# Patient Record
Sex: Male | Born: 1975 | Race: White | Hispanic: No | Marital: Married | Smoking: Former smoker
Health system: Southern US, Community
[De-identification: ages and names within clinical notes are randomized; demographics above are authoritative.]

## PROBLEM LIST (undated history)

## (undated) DIAGNOSIS — I10 Essential (primary) hypertension: Secondary | ICD-10-CM

## (undated) HISTORY — PX: HERNIA REPAIR: SHX51

---

## 2011-03-29 ENCOUNTER — Other Ambulatory Visit: Payer: Self-pay

## 2011-03-29 ENCOUNTER — Ambulatory Visit (HOSPITAL_COMMUNITY)
Admission: RE | Admit: 2011-03-29 | Discharge: 2011-03-29 | Disposition: A | Discharge status: Home / Self Care | Payer: BC Managed Care – PPO | Source: Ambulatory Visit | Attending: Internal Medicine | Admitting: Internal Medicine

## 2011-03-29 ENCOUNTER — Other Ambulatory Visit (HOSPITAL_COMMUNITY): Payer: Self-pay | Admitting: Internal Medicine

## 2011-03-29 ENCOUNTER — Emergency Department (HOSPITAL_COMMUNITY)
Admission: EM | Admit: 2011-03-29 | Discharge: 2011-03-30 | Disposition: A | Discharge status: Home / Self Care | Payer: BC Managed Care – PPO | Attending: Emergency Medicine | Admitting: Emergency Medicine

## 2011-03-29 ENCOUNTER — Encounter: Payer: Self-pay | Admitting: *Deleted

## 2011-03-29 DIAGNOSIS — R0609 Other forms of dyspnea: Secondary | ICD-10-CM | POA: Insufficient documentation

## 2011-03-29 DIAGNOSIS — R55 Syncope and collapse: Secondary | ICD-10-CM | POA: Insufficient documentation

## 2011-03-29 DIAGNOSIS — R0989 Other specified symptoms and signs involving the circulatory and respiratory systems: Secondary | ICD-10-CM | POA: Insufficient documentation

## 2011-03-29 DIAGNOSIS — R071 Chest pain on breathing: Secondary | ICD-10-CM | POA: Insufficient documentation

## 2011-03-29 DIAGNOSIS — R0789 Other chest pain: Secondary | ICD-10-CM

## 2011-03-29 DIAGNOSIS — E876 Hypokalemia: Secondary | ICD-10-CM

## 2011-03-29 DIAGNOSIS — R079 Chest pain, unspecified: Secondary | ICD-10-CM

## 2011-03-29 HISTORY — DX: Essential (primary) hypertension: I10

## 2011-03-29 LAB — CBC
HCT: 38.7 % — ABNORMAL LOW (ref 39.0–52.0)
Hemoglobin: 13.6 g/dL (ref 13.0–17.0)
MCH: 31.5 pg (ref 26.0–34.0)
MCV: 89.6 fL (ref 78.0–100.0)
RBC: 4.32 MIL/uL (ref 4.22–5.81)

## 2011-03-29 LAB — COMPREHENSIVE METABOLIC PANEL
BUN: 10 mg/dL (ref 6–23)
CO2: 29 mEq/L (ref 19–32)
Calcium: 9.2 mg/dL (ref 8.4–10.5)
GFR calc Af Amer: >60 mL/min (ref >60)
GFR calc non Af Amer: >60 mL/min (ref >60)
Glucose, Bld: 126 mg/dL — ABNORMAL HIGH (ref 70–99)
Total Protein: 7 g/dL (ref 6.0–8.3)

## 2011-03-29 LAB — DIFFERENTIAL
Eosinophils Absolute: 0.1 10*3/uL (ref 0.0–0.7)
Eosinophils Relative: 1 % (ref 0–5)
Lymphocytes Relative: 15 % (ref 12–46)
Lymphs Abs: 1.5 10*3/uL (ref 0.7–4.0)
Monocytes Absolute: 0.6 10*3/uL (ref 0.1–1.0)
Monocytes Relative: 6 % (ref 3–12)

## 2011-03-29 LAB — CARDIAC PANEL(CRET KIN+CKTOT+MB+TROPI): CK, MB: 1.5 ng/mL (ref 0.3–4.0)

## 2011-03-29 MED ORDER — IOHEXOL 350 MG/ML SOLN
100.0000 mL | Freq: Once | INTRAVENOUS | Status: AC | PRN
  Administered 2011-03-29: 100 mL via INTRAVENOUS

## 2011-03-29 MED ORDER — POTASSIUM CHLORIDE CRYS ER 20 MEQ PO TBCR
40.0000 meq | EXTENDED_RELEASE_TABLET | Freq: Once | ORAL | Status: AC
  Administered 2011-03-30: 40 meq via ORAL
  Filled 2011-03-29: qty 2

## 2011-03-29 MED ORDER — HYDROCODONE-ACETAMINOPHEN 5-325 MG PO TABS
1.0000 | ORAL_TABLET | Freq: Once | ORAL | Status: AC
  Administered 2011-03-29: 1 via ORAL
  Filled 2011-03-29: qty 1

## 2011-03-29 MED ORDER — KETOROLAC TROMETHAMINE 60 MG/2ML IM SOLN
60.0000 mg | Freq: Once | INTRAMUSCULAR | Status: AC
  Administered 2011-03-29: 60 mg via INTRAMUSCULAR
  Filled 2011-03-29: qty 2

## 2011-03-29 MED ORDER — DIAZEPAM 5 MG PO TABS
5.0000 mg | ORAL_TABLET | Freq: Once | ORAL | Status: AC
  Administered 2011-03-29: 5 mg via ORAL
  Filled 2011-03-29: qty 1

## 2011-03-29 NOTE — ED Notes (Signed)
Chest pain since yesterday , no fever or cough.  Pain lt side of back.

## 2011-03-29 NOTE — ED Notes (Signed)
Ct scan of chest done today as op, says it was normal

## 2011-03-30 MED ORDER — DIAZEPAM 5 MG PO TABS: 5.0000 mg | ORAL_TABLET | Freq: Three times a day (TID) | ORAL | Status: AC | PRN

## 2011-03-30 MED ORDER — OXYCODONE-ACETAMINOPHEN 5-325 MG PO TABS
1.0000 | ORAL_TABLET | ORAL | Status: DC | PRN
  Filled 2011-03-30: qty 1

## 2011-03-30 MED ORDER — KETOROLAC TROMETHAMINE 10 MG PO TABS: 10.0000 mg | ORAL_TABLET | Freq: Four times a day (QID) | ORAL | Status: AC | PRN

## 2011-03-30 MED ORDER — POTASSIUM CHLORIDE CRYS ER 20 MEQ PO TBCR: 20.0000 meq | EXTENDED_RELEASE_TABLET | Freq: Two times a day (BID) | ORAL | Status: DC

## 2011-03-30 MED ORDER — OXYCODONE-ACETAMINOPHEN 5-325 MG PO TABS: 1.0000 | ORAL_TABLET | ORAL | Status: DC | PRN

## 2011-03-30 NOTE — ED Provider Notes (Signed)
Medical screening examination/treatment/procedure(s) were performed by non-physician practitioner and as supervising physician I was immediately available for consultation/collaboration.   Dayton Bailiff, MD 03/30/11 [phone]

## 2011-03-30 NOTE — ED Provider Notes (Addendum)
History     CSN: [id number] Arrival date & time: 03/29/2011  9:20 PM  Chief Complaint  Patient presents with  . Chest Pain    (Consider location/radiation/quality/duration/timing/severity/associated sxs/prior treatment) Patient is a 35 y.o. male presenting with chest pain. The history is provided by the patient, the spouse and a parent.  Chest Pain The chest pain began yesterday (Pain starts in posterior mid back and radiates around to left mid anterior chest.  The pain started yesterday after lifting a heavy object at work.). Chest pain occurs constantly. The chest pain is unchanged. The pain is associated with breathing and lifting (Pain is worsened by movement and palpation). At its most intense, the pain is at 8/10. The pain is currently at 8/10. The quality of the pain is described as sharp and pleuritic. Chest pain is worsened by certain positions, deep breathing and exertion. Primary symptoms include shortness of breath. Pertinent negatives for primary symptoms include no fever, no syncope, no abdominal pain, no nausea and no dizziness. Primary symptoms comment: He has pain with deep inspiration.  Associated symptoms include near-syncope.  Pertinent negatives for associated symptoms include no numbness and no weakness. Associated symptoms comments: He became lightheaded when he got to his car after leaving his physicians office this afternoon.  He did have a chest xray and a Ct angio chest ordered by his pcp which was negative for acute disease.Marland Kitchen He tried narcotics for the symptoms. Risk factors include no known risk factors.  His past medical history is significant for hypertension.  His family medical history is significant for heart disease in family. Family history comments: Parents with history of chf.     Past Medical History  Diagnosis Date  . Hypertension     Past Surgical History  Procedure Date  . Hernia repair     Family History  Problem Relation Age of Onset  .  Heart failure Mother   . Heart failure Father     History  Substance Use Topics  . Smoking status: Never Smoker   . Smokeless tobacco: Not on file  . Alcohol Use: No      Review of Systems  Constitutional: Negative for fever.  HENT: Negative for congestion, sore throat and neck pain.   Eyes: Negative.   Respiratory: Positive for shortness of breath. Negative for chest tightness.   Cardiovascular: Positive for chest pain and near-syncope. Negative for syncope.  Gastrointestinal: Negative for nausea and abdominal pain.  Genitourinary: Negative.   Musculoskeletal: Negative for joint swelling and arthralgias.  Skin: Negative.  Negative for rash and wound.  Neurological: Negative for dizziness, weakness, light-headedness, numbness and headaches.  Hematological: Negative.   Psychiatric/Behavioral: Negative.     Allergies  Review of patient's allergies indicates no known allergies.  Home Medications   Current Outpatient Rx  Name Route Sig Dispense Refill  . CEFDINIR 300 MG PO CAPS Oral Take 300 mg by mouth 2 (two) times daily.      Marland Kitchen HYDROCHLOROTHIAZIDE 25 MG PO TABS Oral Take 12.5 mg by mouth daily.      . IBUPROFEN 200 MG PO TABS Oral Take 800 mg by mouth as needed. For pain     . ACETAMINOPHEN 500 MG PO TABS Oral Take 500 mg by mouth as needed. For pain       BP 151/99  Pulse 95  Temp(Src) 98.5 F (36.9 C) (Oral)  Resp 22  Ht 5\' 9"  (1.753 m)  Wt 237 lb (107.502 kg)  BMI 35.00 kg/m2  SpO2 97%  Physical Exam  Nursing note and vitals reviewed. Constitutional: He is oriented to person, place, and time. He appears well-developed and well-nourished.  HENT:  Head: Normocephalic and atraumatic.  Eyes: Conjunctivae are normal.  Neck: Normal range of motion.  Cardiovascular: Normal rate, regular rhythm, normal heart sounds and intact distal pulses.   Pulmonary/Chest: Effort normal and breath sounds normal. He has no wheezes. He exhibits tenderness.    Abdominal: Soft.  Bowel sounds are normal. There is no tenderness.  Musculoskeletal: Normal range of motion.  Neurological: He is alert and oriented to person, place, and time.  Skin: Skin is warm and dry.  Psychiatric: He has a normal mood and affect.    ED Course  Procedures (including critical care time)  Labs Reviewed  CBC - Abnormal; Notable for the following:    HCT 38.7 (*)    All other components within normal limits  DIFFERENTIAL - Abnormal; Notable for the following:    Neutrophils Relative 78 (*)    Neutro Abs 8.0 (*)    All other components within normal limits  COMPREHENSIVE METABOLIC PANEL - Abnormal; Notable for the following:    Potassium 3.2 (*)    Glucose, Bld 126 (*)    All other components within normal limits  CARDIAC PANEL(CRET KIN+CKTOT+MB+TROPI)   Dg Chest 2 View  03/29/2011  *RADIOLOGY REPORT*  Clinical Data: Chest pain  CHEST - 2 VIEW  Comparison: None.  Findings: Very low lung volumes are seen bilaterally with bibasilar atelectasis.  No evidence of pulmonary consolidation or pleural effusion.  Heart size is within normal limits.  No mass or lymphadenopathy identified.  IMPRESSION: Very low lung volumes with bibasilar atelectasis.  Original Report Authenticated By: Danae Orleans, M.D.   Ct Angio Chest W/cm &/or Wo Cm  03/29/2011  *RADIOLOGY REPORT*  Clinical Data:  Chest pain.  Concern pulmonary embolism.  CT ANGIOGRAPHY CHEST WITH CONTRAST  Technique:  Multidetector CT imaging of the chest was performed using the standard protocol during bolus administration of intravenous contrast.  Multiplanar CT image reconstructions including MIPs were obtained to evaluate the vascular anatomy.  Contrast:  100 ml Omnipaque 300  Comparison:  Chest radiograph 03/29/2011  Findings:  Exam is suboptimal due to patient body habitus.  There are no filling defects in the pulmonary arteries to suggest acute pulmonary embolism.  No acute findings of the  aorta or great vessels.  No pericardial fluid.   Review of the lung parenchyma demonstrates atelectasis at the lung bases.  Mild ground-glass opacities at the lung bases also represents atelectasis or less likely edema.  No focal consolidation.  No pneumothorax.  Airways are normal.  Limited view of the upper abdomen is unremarkable.  Limited view of the skeleton is unremarkable.  Review of the MIP images confirms the above findings.  IMPRESSION:  1.  No evidence acute pulmonary embolism. 2.  Bibasilar atelectasis  Original Report Authenticated By: Genevive Bi, M.D.     No diagnosis found.    MDM  Chest wall pain/ muscle spasm,  Reproducible pain with negative ct angio prior to arrival today.         Candis Musa, PA 03/30/11 0021   Date: 03/30/2011  Rate: 90  Rhythm: normal sinus rhythm  QRS Axis: normal  Intervals: normal  ST/T Wave abnormalities: normal  Conduction Disutrbances:none  Narrative Interpretation:  Incomplete rbbb  Old EKG Reviewed: none available    Candis Musa, PA 03/30/11 0202  Jola Baptist  Dillin Lofgren, PA 04/06/11 1704

## 2011-04-01 NOTE — ED Provider Notes (Signed)
Addendum reviewed and agree  Dayton Bailiff, MD 04/01/11 [phone]

## 2011-04-06 NOTE — ED Provider Notes (Signed)
Additional addendum reviewed and agree  Dayton Bailiff, MD 04/06/11 1723

## 2012-05-10 ENCOUNTER — Encounter (HOSPITAL_COMMUNITY): Payer: Self-pay | Admitting: Emergency Medicine

## 2012-05-10 ENCOUNTER — Emergency Department (HOSPITAL_COMMUNITY)
Admission: EM | Admit: 2012-05-10 | Discharge: 2012-05-10 | Disposition: A | Discharge status: Home / Self Care | Payer: BC Managed Care – PPO | Attending: Emergency Medicine | Admitting: Emergency Medicine

## 2012-05-10 DIAGNOSIS — I10 Essential (primary) hypertension: Secondary | ICD-10-CM | POA: Insufficient documentation

## 2012-05-10 DIAGNOSIS — E876 Hypokalemia: Secondary | ICD-10-CM | POA: Insufficient documentation

## 2012-05-10 DIAGNOSIS — Z79899 Other long term (current) drug therapy: Secondary | ICD-10-CM | POA: Insufficient documentation

## 2012-05-10 LAB — BASIC METABOLIC PANEL
CO2: 28 mEq/L (ref 19–32)
Calcium: 10 mg/dL (ref 8.4–10.5)
Chloride: 99 mEq/L (ref 96–112)
Creatinine, Ser: 0.81 mg/dL (ref 0.50–1.35)
GFR calc Af Amer: >90 mL/min (ref >90)
Sodium: 139 mEq/L (ref 135–145)

## 2012-05-10 LAB — CBC WITH DIFFERENTIAL/PLATELET
Basophils Absolute: 0 10*3/uL (ref 0.0–0.1)
Eosinophils Relative: 1 % (ref 0–5)
HCT: 42.7 % (ref 39.0–52.0)
Lymphocytes Relative: 20 % (ref 12–46)
Lymphs Abs: 1.6 10*3/uL (ref 0.7–4.0)
MCV: 89.9 fL (ref 78.0–100.0)
Neutro Abs: 5.7 10*3/uL (ref 1.7–7.7)
Platelets: 230 10*3/uL (ref 150–400)
RBC: 4.75 MIL/uL (ref 4.22–5.81)
RDW: 12.9 % (ref 11.5–15.5)
WBC: 7.9 10*3/uL (ref 4.0–10.5)

## 2012-05-10 MED ORDER — POTASSIUM CHLORIDE CRYS ER 20 MEQ PO TBCR
40.0000 meq | EXTENDED_RELEASE_TABLET | Freq: Once | ORAL | Status: AC
  Administered 2012-05-10: 40 meq via ORAL
  Filled 2012-05-10: qty 2

## 2012-05-10 NOTE — ED Notes (Signed)
Patient with c/o chest pain and hypertension that started last night. +dizziness. Denies n/v, denies diaphoresis.

## 2012-05-10 NOTE — ED Provider Notes (Signed)
History   This chart was scribed for Cheri Guppy, MD by Melba Coon, ED Scribe. The patient was seen in room APA15/APA15 and the patient's care was started at 3:46PM.    CSN: [id number]  Arrival date & time 05/10/12  1415   First MD Initiated Contact with Patient 05/10/12 1542      Chief Complaint  Patient presents with  . Chest Pain  . Hypertension    (Consider location/radiation/quality/duration/timing/severity/associated sxs/prior treatment) The history is provided by the patient and the spouse. No language interpreter was used.   Chris Griffith is a 36 y.o. male who presents to the Emergency Department complaining of feeling tired and dizzy with a onset last night. Wife reports that BP was 150/100 last night. He currently take hydrochlorothiazide to manage his HTN. He reports fatigue since yesterday and reports dizziness and pallor. He denies any chest pain currently here at the ED. Denies rash, back pain, abd pain, n/v/d. No other pertinent medical symptoms.  Past Medical History  Diagnosis Date  . Hypertension     Past Surgical History  Procedure Date  . Hernia repair     Family History  Problem Relation Age of Onset  . Heart failure Mother   . Heart failure Father     History  Substance Use Topics  . Smoking status: Never Smoker   . Smokeless tobacco: Not on file  . Alcohol Use: No      Review of Systems 10 Systems reviewed and all are negative for acute change except as noted in the HPI.   Allergies  Review of patient's allergies indicates no known allergies.  Home Medications   Current Outpatient Rx  Name  Route  Sig  Dispense  Refill  . ACETAMINOPHEN 500 MG PO TABS   Oral   Take 500 mg by mouth as needed. For pain          . CEFDINIR 300 MG PO CAPS   Oral   Take 300 mg by mouth 2 (two) times daily.           Marland Kitchen HYDROCHLOROTHIAZIDE 25 MG PO TABS   Oral   Take 12.5 mg by mouth daily.           . IBUPROFEN 200 MG PO TABS  Oral   Take 800 mg by mouth as needed. For pain          . POTASSIUM CHLORIDE CRYS ER 20 MEQ PO TBCR   Oral   Take 1 tablet (20 mEq total) by mouth 2 (two) times daily.   14 tablet   0     BP 127/88  Pulse 93  Temp 98.1 F (36.7 C) (Oral)  Resp 20  Ht 5\' 9"  (1.753 m)  Wt 230 lb (104.327 kg)  BMI 33.96 kg/m2  SpO2 95%  Physical Exam  Nursing note and vitals reviewed. Constitutional:       Awake, alert, nontoxic appearance.  HENT:  Head: Normocephalic and atraumatic.  Eyes: Conjunctivae normal and EOM are normal. Pupils are equal, round, and reactive to light. Right eye exhibits no discharge. Left eye exhibits no discharge.  Neck: Normal range of motion. Neck supple.       No carotid bruit bilaterally.  Cardiovascular: Normal rate, regular rhythm and intact distal pulses.  Exam reveals gallop.   No murmur heard. Pulmonary/Chest: Effort normal and breath sounds normal. No respiratory distress. He has no wheezes. He has no rales. He exhibits no tenderness.  Abdominal: Soft. Bowel  sounds are normal. There is no tenderness. There is no rebound.  Musculoskeletal: He exhibits no tenderness.       Baseline ROM, no obvious new focal weakness.  Neurological:       Mental status and motor strength appears baseline for patient and situation.  Skin: No rash noted.  Psychiatric: He has a normal mood and affect.    ED Course  Procedures (including critical care time)  DIAGNOSTIC STUDIES: Oxygen Saturation is 99% on room air, normal by my interpretation.    COORDINATION OF CARE:  3:51PM - general chemistries will be ordered for [name].   Labs Reviewed - No data to display No results found.   No diagnosis found.    MDM  Fatigue Mild hypokalemia  I personally performed the services described in this documentation, which was scribed in my presence. The recorded information has been reviewed and is accurate.        Cheri Guppy, MD 05/10/12 1731

## 2012-06-05 ENCOUNTER — Emergency Department (HOSPITAL_COMMUNITY)
Admission: EM | Admit: 2012-06-05 | Discharge: 2012-06-05 | Disposition: A | Discharge status: Home / Self Care | Payer: BC Managed Care – PPO | Attending: Emergency Medicine | Admitting: Emergency Medicine

## 2012-06-05 ENCOUNTER — Encounter (HOSPITAL_COMMUNITY): Payer: Self-pay | Admitting: Emergency Medicine

## 2012-06-05 DIAGNOSIS — K529 Noninfective gastroenteritis and colitis, unspecified: Secondary | ICD-10-CM

## 2012-06-05 DIAGNOSIS — Z79899 Other long term (current) drug therapy: Secondary | ICD-10-CM | POA: Insufficient documentation

## 2012-06-05 DIAGNOSIS — K5289 Other specified noninfective gastroenteritis and colitis: Secondary | ICD-10-CM | POA: Insufficient documentation

## 2012-06-05 DIAGNOSIS — R197 Diarrhea, unspecified: Secondary | ICD-10-CM | POA: Insufficient documentation

## 2012-06-05 DIAGNOSIS — I10 Essential (primary) hypertension: Secondary | ICD-10-CM | POA: Insufficient documentation

## 2012-06-05 LAB — CBC WITH DIFFERENTIAL/PLATELET
Basophils Absolute: 0 10*3/uL (ref 0.0–0.1)
Eosinophils Relative: 0 % (ref 0–5)
HCT: 42.5 % (ref 39.0–52.0)
Hemoglobin: 15.1 g/dL (ref 13.0–17.0)
Lymphocytes Relative: 2 % — ABNORMAL LOW (ref 12–46)
Lymphs Abs: 0.3 10*3/uL — ABNORMAL LOW (ref 0.7–4.0)
MCV: 89.9 fL (ref 78.0–100.0)
Monocytes Absolute: 0.3 10*3/uL (ref 0.1–1.0)
Monocytes Relative: 2 % — ABNORMAL LOW (ref 3–12)
Neutro Abs: 10.7 10*3/uL — ABNORMAL HIGH (ref 1.7–7.7)
RBC: 4.73 MIL/uL (ref 4.22–5.81)
WBC: 11.2 10*3/uL — ABNORMAL HIGH (ref 4.0–10.5)

## 2012-06-05 LAB — BASIC METABOLIC PANEL
BUN: 16 mg/dL (ref 6–23)
CO2: 26 mEq/L (ref 19–32)
Calcium: 8.9 mg/dL (ref 8.4–10.5)
Chloride: 102 mEq/L (ref 96–112)
Creatinine, Ser: 0.79 mg/dL (ref 0.50–1.35)
Glucose, Bld: 128 mg/dL — ABNORMAL HIGH (ref 70–99)

## 2012-06-05 MED ORDER — PROMETHAZINE HCL 25 MG/ML IJ SOLN
12.5000 mg | Freq: Once | INTRAMUSCULAR | Status: AC
  Administered 2012-06-05: 12.5 mg via INTRAVENOUS
  Filled 2012-06-05: qty 1

## 2012-06-05 MED ORDER — ONDANSETRON HCL 4 MG/2ML IJ SOLN
4.0000 mg | Freq: Once | INTRAMUSCULAR | Status: AC
  Administered 2012-06-05: 4 mg via INTRAVENOUS
  Filled 2012-06-05: qty 2

## 2012-06-05 MED ORDER — ONDANSETRON HCL 4 MG/2ML IJ SOLN
4.0000 mg | Freq: Once | INTRAMUSCULAR | Status: AC
Start: 2012-06-05 — End: 2012-06-05
  Administered 2012-06-05: 4 mg via INTRAVENOUS
  Filled 2012-06-05: qty 2

## 2012-06-05 MED ORDER — KETOROLAC TROMETHAMINE 30 MG/ML IJ SOLN
30.0000 mg | Freq: Once | INTRAMUSCULAR | Status: AC
  Administered 2012-06-05: 30 mg via INTRAVENOUS
  Filled 2012-06-05: qty 1

## 2012-06-05 MED ORDER — PROMETHAZINE HCL 25 MG PO TABS: 25.0000 mg | ORAL_TABLET | Freq: Four times a day (QID) | ORAL | Status: DC | PRN

## 2012-06-05 MED ORDER — SODIUM CHLORIDE 0.9 % IV BOLUS (SEPSIS)
1000.0000 mL | Freq: Once | INTRAVENOUS | Status: AC
  Administered 2012-06-05: 1000 mL via INTRAVENOUS

## 2012-06-05 NOTE — ED Notes (Signed)
Pt reports severe nausea

## 2012-06-05 NOTE — ED Notes (Signed)
Pt started with n/v/d since 12am. Family ate at [place] and all three have had similar symptoms just not as serve as patient.

## 2012-06-05 NOTE — ED Provider Notes (Signed)
History   This chart was scribed for Geoffery Lyons, MD by Charolett Bumpers, ED Scribe. The patient was seen in room APA10/APA10. Patient's care was started at 0723.   CSN: [id number]  Arrival date & time 06/05/12  0720   First MD Initiated Contact with Patient 06/05/12 [phone]      Chief Complaint  Patient presents with  . Emesis  . Diarrhea    The history is provided by the patient. No language interpreter was used.  Chris Griffith is a 36 y.o. male who presents to the Emergency Department complaining of multiple episodes of moderate vomiting with associated diarrhea since 12 am this morning. Family states that they ate [place] food last night. Within 2 hours of eating, the pt started vomiting. She states that her son also had similar symptoms after eating the same thing. The pt also reports associated nausea and minimal abdominal discomfort. She states that she also noted hematemesis with droplets of blood. He denies any melena. He has a h/o hernia repair but denies any other abdominal surgeries. He also has a h/o HTN.   Past Medical History  Diagnosis Date  . Hypertension     Past Surgical History  Procedure Date  . Hernia repair     Family History  Problem Relation Age of Onset  . Heart failure Mother   . Heart failure Father     History  Substance Use Topics  . Smoking status: Never Smoker   . Smokeless tobacco: Not on file  . Alcohol Use: No      Review of Systems  Gastrointestinal: Positive for nausea, vomiting, abdominal pain and diarrhea.  All other systems reviewed and are negative.    Allergies  Codeine  Home Medications   Current Outpatient Rx  Name  Route  Sig  Dispense  Refill  . ACETAMINOPHEN 500 MG PO TABS   Oral   Take 500 mg by mouth as needed. For pain          . HYDROCHLOROTHIAZIDE 25 MG PO TABS   Oral   Take 12.5 mg by mouth daily.           . IBUPROFEN 200 MG PO TABS   Oral   Take 800 mg by mouth as needed. For pain           . POTASSIUM CHLORIDE CRYS ER 20 MEQ PO TBCR   Oral   Take 1 tablet (20 mEq total) by mouth 2 (two) times daily.   14 tablet   0     There were no vitals taken for this visit.  Physical Exam  Nursing note and vitals reviewed. Constitutional: He is oriented to person, place, and time. He appears well-developed and well-nourished. No distress.  HENT:  Head: Normocephalic and atraumatic.  Right Ear: External ear normal.  Left Ear: External ear normal.  Nose: Nose normal.  Mouth/Throat: Oropharynx is clear and moist.  Eyes: Conjunctivae normal and EOM are normal. Pupils are equal, round, and reactive to light.  Neck: Normal range of motion. Neck supple. No tracheal deviation present.  Cardiovascular: Normal rate, regular rhythm and normal heart sounds.   No murmur heard. Pulmonary/Chest: Effort normal and breath sounds normal. No respiratory distress. He has no wheezes.  Abdominal: Soft. Bowel sounds are normal. He exhibits no distension. There is no tenderness.  Musculoskeletal: Normal range of motion. He exhibits no edema.  Neurological: He is alert and oriented to person, place, and time.  Skin: Skin is  warm and dry.  Psychiatric: He has a normal mood and affect. His behavior is normal.    ED Course  Procedures (including critical care time)  DIAGNOSTIC STUDIES: Oxygen Saturation is 97% on room air, adequate by my interpretation.    COORDINATION OF CARE:  08:40-Discussed planned course of treatment with the patient including IV fluids, pain and nausea medication, CBC and BMP, who is agreeable at this time.   08:45-Medication Orders: Sodium chloride 0.9% bolus 1,000 mL-once; Ketorolac (Toradol) 30 mg/mL injection 30 mg-once; Ondansetron (Zofran) injection 4 mg-once.   09:35-Recheck: Informed pt of lab results. Pt states that he still feels nauseated. Will give pt Phenergan prior to d/c.   Labs Reviewed - No data to display No results found.   No diagnosis  found.    MDM  Presentation, exam, and test results consistent with viral gastroenteritis.  Will discharge with phenergan, return prn.  Wife reports seeing blood in vomit, likely mallory weiss in nature.  His Hb is stable and appears comfortable.       I personally performed the services described in this documentation, which was scribed in my presence. The recorded information has been reviewed and is accurate.       Geoffery Lyons, MD 06/05/12 [phone]

## 2012-10-29 HISTORY — PX: VASECTOMY: SHX75

## 2013-01-14 ENCOUNTER — Encounter: Payer: Self-pay | Admitting: Family Medicine

## 2013-01-14 ENCOUNTER — Ambulatory Visit (INDEPENDENT_AMBULATORY_CARE_PROVIDER_SITE_OTHER): Payer: BC Managed Care – PPO | Admitting: Family Medicine

## 2013-01-14 VITALS — BP 128/90 | Temp 98.3°F | Wt 226.0 lb

## 2013-01-14 DIAGNOSIS — Z79899 Other long term (current) drug therapy: Secondary | ICD-10-CM

## 2013-01-14 DIAGNOSIS — J329 Chronic sinusitis, unspecified: Secondary | ICD-10-CM

## 2013-01-14 DIAGNOSIS — I10 Essential (primary) hypertension: Secondary | ICD-10-CM | POA: Insufficient documentation

## 2013-01-14 MED ORDER — ONDANSETRON 4 MG PO TBDP: 4.0000 mg | ORAL_TABLET | Freq: Four times a day (QID) | ORAL | Status: DC | PRN

## 2013-01-14 MED ORDER — LEVOFLOXACIN 500 MG PO TABS: 500.0000 mg | ORAL_TABLET | Freq: Every day | ORAL | Status: AC

## 2013-01-14 MED ORDER — ENALAPRIL MALEATE 10 MG PO TABS: 10.0000 mg | ORAL_TABLET | Freq: Every day | ORAL | Status: DC

## 2013-01-14 NOTE — Progress Notes (Signed)
  Subjective:    Patient ID: Chris Griffith., male    DOB: Jul 05, 1975, 37 y.o.   MRN: [id number]  Sinusitis This is a chronic problem. The current episode started more than 1 month ago. The problem has been gradually worsening since onset. Associated symptoms include congestion, coughing, headaches and a hoarse voice. Past treatments include antibiotics (took amox and took it.). The treatment provided no relief.   Persist headaches on z pk following the amox  Frontal headache. Tender node on post of neck Patient has history of hypertension. He is just on one half a hydrochlorothiazide tablet daily. He feels it may cause him fatigue. He also is had difficulty with low potassium.  Review of Systems  HENT: Positive for congestion and hoarse voice.   Respiratory: Positive for cough.   Neurological: Positive for headaches.       Objective:   Physical Exam Alert mild malaise. Blood pressure 140/90 on repeat. Lungs clear. Heart regular rate and rhythm. Frontal tenderness. TMs normal pharynx slight erythema neck supple       Assessment & Plan:  Impression 1 subacute sinusitis. #2 hypertension suboptimal in control. plan Levaquin 500 daily for 14 days due to subacute nature. Change to Vasotec 10 mg each bedtime. Stop hydrochlorothiazide. Recheck as scheduled. WSL

## 2013-01-15 LAB — LIPID PANEL
Cholesterol: 167 mg/dL (ref 0–200)
LDL Cholesterol: 97 mg/dL (ref 0–99)
Total CHOL/HDL Ratio: 4.9 Ratio
VLDL: 36 mg/dL (ref 0–40)

## 2013-01-15 LAB — HEPATIC FUNCTION PANEL
ALT: 37 U/L (ref 0–53)
Bilirubin, Direct: 0.1 mg/dL (ref 0.0–0.3)

## 2013-01-15 LAB — BASIC METABOLIC PANEL
Chloride: 102 mEq/L (ref 96–112)
Glucose, Bld: 103 mg/dL — ABNORMAL HIGH (ref 70–99)
Potassium: 4.4 mEq/L (ref 3.5–5.3)
Sodium: 140 mEq/L (ref 135–145)

## 2013-01-18 ENCOUNTER — Telehealth: Payer: Self-pay | Admitting: Family Medicine

## 2013-01-18 ENCOUNTER — Encounter: Payer: Self-pay | Admitting: Family Medicine

## 2013-01-18 NOTE — Telephone Encounter (Signed)
We saw the pt four d ago for rhinosinusitis--I think it is premature to lable this as ace-induced cough. Go back to old fluid pill. Then restart ace inhibito in a few wks. If cough appears, call us

## 2013-01-18 NOTE — Telephone Encounter (Signed)
Pt calling with a cough today and states it is due to his BP medicine he just started. Was told to call in if there were any issues, what do you recommend. [facility]

## 2013-01-18 NOTE — Telephone Encounter (Signed)
Wife called to check the status of this request.  Informed her due to computer issues we were running a little behind.  States he started coughing immediately after going onto the new BP Medication.

## 2013-01-18 NOTE — Telephone Encounter (Signed)
Notified pt the doctor thinks it is premature to label this as ace-induced cough. Go back to old fluid pill. Then restart ace inhibitor in a few wks. If cough appears, call us. Patient verbalized understanding.

## 2013-02-02 ENCOUNTER — Encounter: Payer: Self-pay | Admitting: Family Medicine

## 2013-02-02 ENCOUNTER — Ambulatory Visit (INDEPENDENT_AMBULATORY_CARE_PROVIDER_SITE_OTHER): Payer: BC Managed Care – PPO | Admitting: Family Medicine

## 2013-02-02 VITALS — BP 148/94 | Temp 98.3°F | Ht 69.0 in | Wt 233.0 lb

## 2013-02-02 DIAGNOSIS — R1030 Lower abdominal pain, unspecified: Secondary | ICD-10-CM

## 2013-02-02 DIAGNOSIS — R109 Unspecified abdominal pain: Secondary | ICD-10-CM

## 2013-02-02 MED ORDER — DOXYCYCLINE HYCLATE 100 MG PO CAPS: 100.0000 mg | ORAL_CAPSULE | Freq: Two times a day (BID) | ORAL | Status: DC

## 2013-02-02 NOTE — Progress Notes (Signed)
  Subjective:    Patient ID: Chris Griffith., male    DOB: [birth year], 37 y.o.   MRN: [id number]  HPIHaving pain in groin area and goes down right leg. Started last week. Has been taking tylenol with mild relief. Knot in testicle noticied last sat.  This patient relates he had a vasectomy earlier this year he has pain in the right groin sometimes radiates down the right leg been present for the past several days denies dysuria denies high fever sweats chills nausea vomiting or diarrhea. In addition to this the discomfort is more prominent in certain positions. No pain with sex. No hematuria. PMH benign FMH benign  Review of Systems see above     Objective:   Physical Exam Lungs clear heart regular abdomen soft no masses no hernia detected testicular exam totally normal bilateral epididymitis area is nontender there is no masses felt but not he is concerned about I think is more the vas deferens on the right side  Prostate exam was normal     Assessment & Plan:  Groin pain-perplexing why he has a spirit I cannot totally nail down what is causing this. I think it is reasonable to try cool compresses doxycycline twice a day for 1 week in case this is developing epididymitis. If the symptoms persist he needs to let us know. I told the patient if he gets worse he needs to let us know. May need to go back to urology depending on how he does. Warning signs were discussed  He was told to followup if ongoing or call.

## 2013-04-13 ENCOUNTER — Encounter: Payer: Self-pay | Admitting: Family Medicine

## 2013-04-13 ENCOUNTER — Ambulatory Visit (INDEPENDENT_AMBULATORY_CARE_PROVIDER_SITE_OTHER): Payer: BC Managed Care – PPO | Admitting: Family Medicine

## 2013-04-13 VITALS — BP 138/96 | Temp 98.6°F | Ht 69.0 in | Wt 235.2 lb

## 2013-04-13 DIAGNOSIS — I10 Essential (primary) hypertension: Secondary | ICD-10-CM

## 2013-04-13 DIAGNOSIS — R21 Rash and other nonspecific skin eruption: Secondary | ICD-10-CM

## 2013-04-13 MED ORDER — VERAPAMIL HCL ER 180 MG PO TBCR: 180.0000 mg | EXTENDED_RELEASE_TABLET | Freq: Every day | ORAL | Status: DC

## 2013-04-13 MED ORDER — TRIAMCINOLONE ACETONIDE 0.1 % EX CREA: TOPICAL_CREAM | Freq: Two times a day (BID) | CUTANEOUS | Status: DC

## 2013-04-13 NOTE — Progress Notes (Signed)
  Subjective:    Patient ID: Chris Griffith., male    DOB: Oct 16, 1975, 37 y.o.   MRN: [id number]  Foot Pain This is a new problem. The current episode started in the past 7 days. The problem occurs constantly. The problem has been gradually worsening. The symptoms are aggravated by walking and standing. He has tried NSAIDs for the symptoms. The treatment provided no relief.   Foot tingling. Felt like a know in it. Went away  Felt swollen, no major problem.  Itches and burns,  Painful at times, itchy at times,    Review of Systems No trouble swallowing no chest pain no headache    Objective:   Physical Exam  Alert no apparent distress. Lungs clear. Heart regular in rhythm. Pressure 134/90. Feet patchy rash evident somewhat pruritic.      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 Start verapamil. Triamcinolone cream twice a day to affected area. Recheck in several months. WSL

## 2013-04-15 ENCOUNTER — Telehealth: Payer: Self-pay | Admitting: Family Medicine

## 2013-04-15 ENCOUNTER — Encounter: Payer: Self-pay | Admitting: Family Medicine

## 2013-04-15 NOTE — Telephone Encounter (Signed)
Pt wants to know if he can have a WE for 10/14-10/17 to return on 10/20. His feet are still giving him issues. Getting better but feeels he needs the extra days

## 2013-04-15 NOTE — Telephone Encounter (Signed)
ok 

## 2013-04-16 NOTE — Telephone Encounter (Signed)
Pt notified to pick up note up front

## 2013-10-18 ENCOUNTER — Ambulatory Visit (INDEPENDENT_AMBULATORY_CARE_PROVIDER_SITE_OTHER): Payer: BC Managed Care – PPO | Admitting: Family Medicine

## 2013-10-18 ENCOUNTER — Encounter: Payer: Self-pay | Admitting: Family Medicine

## 2013-10-18 VITALS — BP 148/98 | Ht 69.0 in | Wt 232.0 lb

## 2013-10-18 DIAGNOSIS — M549 Dorsalgia, unspecified: Secondary | ICD-10-CM

## 2013-10-18 MED ORDER — CHLORZOXAZONE 500 MG PO TABS: 500.0000 mg | ORAL_TABLET | Freq: Three times a day (TID) | ORAL | Status: DC | PRN

## 2013-10-18 MED ORDER — ETODOLAC 400 MG PO TABS: 400.0000 mg | ORAL_TABLET | Freq: Two times a day (BID) | ORAL | Status: DC

## 2013-10-18 NOTE — Progress Notes (Signed)
   Subjective:    Patient ID: Chris Holidayonald Keith Vasbinder Jr., male    DOB: 07/24/1975, 38 y.o.   MRN: [id number]  Back Pain This is a chronic problem. The current episode started more than 1 year ago (15 years ago). The problem has been waxing and waning since onset. Pain location: Right side of lower back. The pain radiates to the right thigh. The pain is worse during the day. The symptoms are aggravated by sitting (Lifting and driving). Stiffness is present in the morning. He has tried NSAIDs, ice and heat for the symptoms. The treatment provided no relief.   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   no vomiting no diarrhea ROS otherwise negative     Objective:   Physical Exam Alert blood pressure repeat 138/86. HEENT normal neck supple. Lungs clear. Heart rare rhythm. Right paralumbar tenderness. Negative straight leg raise.       Assessment & Plan:  Impression #1 lumbar strain discussed, recurrent symptomatology in recent years. History of remote work injury #2 hypertension control with just diet and exercise at this point. Plan Lodine twice a day with food. Chlorzoxazone when necessary. Regular low back exercises strongly encourage. Diet exercise discussed. WSL

## 2013-10-19 ENCOUNTER — Encounter: Payer: Self-pay | Admitting: Family Medicine

## 2013-12-13 ENCOUNTER — Ambulatory Visit: Payer: BC Managed Care – PPO | Admitting: Family Medicine

## 2013-12-13 ENCOUNTER — Ambulatory Visit (INDEPENDENT_AMBULATORY_CARE_PROVIDER_SITE_OTHER): Payer: BC Managed Care – PPO | Admitting: Family Medicine

## 2013-12-13 ENCOUNTER — Encounter: Payer: Self-pay | Admitting: Family Medicine

## 2013-12-13 VITALS — BP 128/90 | Temp 98.2°F | Ht 69.0 in | Wt 226.0 lb

## 2013-12-13 DIAGNOSIS — L738 Other specified follicular disorders: Secondary | ICD-10-CM

## 2013-12-13 DIAGNOSIS — L739 Follicular disorder, unspecified: Secondary | ICD-10-CM

## 2013-12-13 MED ORDER — CEPHALEXIN 500 MG PO CAPS: 500.0000 mg | ORAL_CAPSULE | Freq: Three times a day (TID) | ORAL | Status: DC

## 2013-12-13 NOTE — Progress Notes (Signed)
   Subjective:    Patient ID: Chris Holidayonald Keith Madole Jr., male    DOB: Jun 17, 1976, 38 y.o.   MRN: [id number]  HPI  Patient is here today for a knot on the back of his head. It is on the right side, behind his ear.  He first noticed it Thursday.  He does have some nasal congestion.  No other s/s  Patient also notes a rash in his scalp. Pimple-like rash. Somewhat tender.  Review of Systems No vomiting no headache no trouble swallowing no chest pain no fever ROS otherwise negative    Objective:   Physical Exam  Alert blood pressure repeat 122/88 HEENT right posterior scalp folliculitis ruptured. Right posterior cervical chain reactive lymph node palpated neck supple pharynx normal. Lungs clear heart regular in rhythm.      Assessment & Plan:  Impression folliculitis with secondary lymphadenitis discussed plan Keflex 500 3 times a day 7 days. Symptomatic care discussed. Local measures discussed. WSL

## 2014-01-07 ENCOUNTER — Telehealth: Payer: Self-pay | Admitting: Family Medicine

## 2014-01-07 MED ORDER — TRIAMCINOLONE ACETONIDE 0.1 % EX CREA: 1.0000 "application " | TOPICAL_CREAM | Freq: Two times a day (BID) | CUTANEOUS | Status: DC | PRN

## 2014-01-07 NOTE — Telephone Encounter (Signed)
May give refill of this x260 g tube apply twice a day when necessary

## 2014-01-07 NOTE — Telephone Encounter (Signed)
triamcinolone cream (KENALOG) 0.1 %  Pt has some rash around his ankles from weed eating  He tried this cream you had given him before an it had  Worked to clear it up some but is out now. Can we call In some more to   [facility]

## 2014-01-07 NOTE — Telephone Encounter (Signed)
Pt's wife notified.

## 2014-01-07 NOTE — Telephone Encounter (Signed)
Medication was sent to pharmacy.

## 2015-08-16 ENCOUNTER — Encounter: Payer: Self-pay | Admitting: Family Medicine

## 2015-08-16 ENCOUNTER — Ambulatory Visit (INDEPENDENT_AMBULATORY_CARE_PROVIDER_SITE_OTHER): Payer: BLUE CROSS/BLUE SHIELD | Admitting: Family Medicine

## 2015-08-16 VITALS — BP 138/96 | Temp 98.6°F | Ht 69.0 in | Wt 240.0 lb

## 2015-08-16 DIAGNOSIS — K2100 Gastro-esophageal reflux disease with esophagitis, without bleeding: Secondary | ICD-10-CM

## 2015-08-16 DIAGNOSIS — J329 Chronic sinusitis, unspecified: Secondary | ICD-10-CM | POA: Diagnosis not present

## 2015-08-16 DIAGNOSIS — I10 Essential (primary) hypertension: Secondary | ICD-10-CM | POA: Diagnosis not present

## 2015-08-16 DIAGNOSIS — Z13220 Encounter for screening for lipoid disorders: Secondary | ICD-10-CM | POA: Diagnosis not present

## 2015-08-16 DIAGNOSIS — R079 Chest pain, unspecified: Secondary | ICD-10-CM | POA: Diagnosis not present

## 2015-08-16 DIAGNOSIS — Z79899 Other long term (current) drug therapy: Secondary | ICD-10-CM | POA: Diagnosis not present

## 2015-08-16 MED ORDER — AZITHROMYCIN 250 MG PO TABS: ORAL_TABLET | ORAL | Status: DC

## 2015-08-16 MED ORDER — PANTOPRAZOLE SODIUM 40 MG PO TBEC: 40.0000 mg | DELAYED_RELEASE_TABLET | Freq: Every day | ORAL | Status: DC

## 2015-08-16 MED ORDER — SUCRALFATE 1 G PO TABS: ORAL_TABLET | ORAL | Status: DC

## 2015-08-16 NOTE — Progress Notes (Signed)
   Subjective:    Patient ID: Chris Griffith., male    DOB: [birth year], 40 y.o.   MRN: [id number]  Chest Pain  This is a new problem. Episode onset: 6 days ago. Quality: feels like gas pressure. Associated symptoms include back pain and nausea. Associated symptoms comments: Chest pain on right side. The pain is aggravated by food. Treatments tried: gas pills, ibuprofen.   Headache, coughing up green mucus, sinus drainage, no fever, no wheezing. Started 2 days ago.  Requesting zpack.   Took gas x and tried some probiotics  After lunch  Pos hx of reflux  Sat night got the worst, usually worse after eating  Some radiation to back   Some nausea,  No sob no diaphoresis   Patient quit smoking 10 years ago   Review of Systems  Cardiovascular: Positive for chest pain.  Gastrointestinal: Positive for nausea.  Musculoskeletal: Positive for back pain.       Objective:   Physical Exam  Alert no major distress H&T moderate his congestion pharynx normal neck supple lungs clear heart abdomen benign  EKG normal sinus rhythm no significant ST-T changes      Assessment & Plan:  Impression 1 chest pain likely gastroesophageal reflux/esophagitis in nature discussed #2 elevated blood pressure over 3 rhinosinusitis plan screening blood work. Trial of Protonix and Carafate. Warning signs discussed carefully. Appropriate screening blood work requested by patient WSL

## 2015-08-17 ENCOUNTER — Telehealth: Payer: Self-pay | Admitting: Family Medicine

## 2015-08-17 LAB — BASIC METABOLIC PANEL
BUN / CREAT RATIO: 14 (ref 9–20)
BUN: 12 mg/dL (ref 6–24)
CO2: 24 mmol/L (ref 18–29)
CREATININE: 0.88 mg/dL (ref 0.76–1.27)
Calcium: 9.9 mg/dL (ref 8.7–10.2)
Chloride: 98 mmol/L (ref 96–106)
GFR calc Af Amer: 123 mL/min/{1.73_m2} (ref >59)
GFR, EST NON AFRICAN AMERICAN: 107 mL/min/{1.73_m2} (ref >59)
GLUCOSE: 99 mg/dL (ref 65–99)
POTASSIUM: 4.8 mmol/L (ref 3.5–5.2)
SODIUM: 141 mmol/L (ref 134–144)

## 2015-08-17 LAB — HEPATIC FUNCTION PANEL
ALK PHOS: 66 IU/L (ref 39–117)
ALT: 26 IU/L (ref 0–44)
AST: 21 IU/L (ref 0–40)
Albumin: 4.5 g/dL (ref 3.5–5.5)
BILIRUBIN, DIRECT: 0.16 mg/dL (ref 0.00–0.40)
Bilirubin Total: 0.7 mg/dL (ref 0.0–1.2)
TOTAL PROTEIN: 7 g/dL (ref 6.0–8.5)

## 2015-08-17 LAB — LIPID PANEL
CHOLESTEROL TOTAL: 158 mg/dL (ref 100–199)
Chol/HDL Ratio: 3.9 ratio units (ref 0.0–5.0)
HDL: 41 mg/dL (ref >39)
LDL Calculated: 95 mg/dL (ref 0–99)
TRIGLYCERIDES: 111 mg/dL (ref 0–149)
VLDL CHOLESTEROL CAL: 22 mg/dL (ref 5–40)

## 2015-08-17 MED ORDER — AZITHROMYCIN 200 MG/5ML PO SUSR: ORAL | Status: DC

## 2015-08-17 NOTE — Telephone Encounter (Signed)
zith 500 susp day one, 250 susp day two thru five

## 2015-08-17 NOTE — Telephone Encounter (Signed)
Patient was prescribed azithromycin (ZITHROMAX) 250 MG tablet yesterday, but because his reflux is so severe, he cannot get the pill to go down.  Can we call in a liquid form?     [facility] on [address]

## 2015-08-17 NOTE — Telephone Encounter (Signed)
Rx sent electronically to pharmacy. Patient notified. 

## 2015-08-20 ENCOUNTER — Encounter: Payer: Self-pay | Admitting: Family Medicine

## 2015-08-25 ENCOUNTER — Encounter: Payer: Self-pay | Admitting: Family Medicine

## 2015-10-07 ENCOUNTER — Other Ambulatory Visit: Payer: Self-pay | Admitting: Family Medicine

## 2017-05-19 ENCOUNTER — Ambulatory Visit: Payer: Managed Care, Other (non HMO) | Admitting: Family Medicine

## 2017-05-19 ENCOUNTER — Encounter: Payer: Self-pay | Admitting: Family Medicine

## 2017-05-19 VITALS — BP 144/98 | Temp 98.5°F | Ht 69.0 in | Wt 240.0 lb

## 2017-05-19 DIAGNOSIS — J329 Chronic sinusitis, unspecified: Secondary | ICD-10-CM | POA: Diagnosis not present

## 2017-05-19 MED ORDER — CEFDINIR 300 MG PO CAPS: 300.0000 mg | ORAL_CAPSULE | Freq: Two times a day (BID) | ORAL | 0 refills | Status: DC

## 2017-05-19 NOTE — Progress Notes (Signed)
   Subjective:    Patient ID: Chris Holidayonald Keith Meineke Jr., male    DOB: 12-30-1975, 41 y.o.   MRN: [id number]  Sinusitis  This is a new problem. Episode onset: one week. Associated symptoms include congestion, coughing, ear pain, headaches and a sore throat. (Wheezing)    One weeks worth of cough and cong and dranage  Exposed to sick others  nasa fluid discharge, occurred with leanin forward  Happened last sat   And happened one yr ago  Also   Gets some fall alergies uses flonase offrbrand and prn   No hx of bad head in juries     Review of Systems  HENT: Positive for congestion, ear pain and sore throat.   Respiratory: Positive for cough.   Neurological: Positive for headaches.       Objective:   Physical Exam Alert, mild malaise. Hydration good Vitals stable. frontal/ maxillary tenderness evident positive nasal congestion. pharynx normal neck supple  lungs clear/no crackles or wheezes. heart regular in rhythm        Assessment & Plan:  Impression rhinosinusitis likely post viral, discussed with patient. plan antibiotics prescribed. Questions answered. Symptomatic care discussed. warning signs discussed. WSL Rare sporadic nasal discharge likely appearing sinus opens.  Occurs 1 year apart.  No history of head injury.  Highly doubt cerebrospinal fluid leakage discussed

## 2018-01-05 ENCOUNTER — Other Ambulatory Visit: Payer: Self-pay

## 2018-01-05 ENCOUNTER — Encounter: Payer: Self-pay | Admitting: Emergency Medicine

## 2018-01-05 DIAGNOSIS — K3532 Acute appendicitis with perforation and localized peritonitis, without abscess: Principal | ICD-10-CM | POA: Diagnosis present

## 2018-01-05 DIAGNOSIS — Z79899 Other long term (current) drug therapy: Secondary | ICD-10-CM

## 2018-01-05 DIAGNOSIS — Z885 Allergy status to narcotic agent status: Secondary | ICD-10-CM

## 2018-01-05 DIAGNOSIS — Z888 Allergy status to other drugs, medicaments and biological substances status: Secondary | ICD-10-CM

## 2018-01-05 DIAGNOSIS — I10 Essential (primary) hypertension: Secondary | ICD-10-CM | POA: Diagnosis present

## 2018-01-05 DIAGNOSIS — R1031 Right lower quadrant pain: Secondary | ICD-10-CM | POA: Diagnosis not present

## 2018-01-05 DIAGNOSIS — K429 Umbilical hernia without obstruction or gangrene: Secondary | ICD-10-CM | POA: Diagnosis present

## 2018-01-05 DIAGNOSIS — Z87891 Personal history of nicotine dependence: Secondary | ICD-10-CM

## 2018-01-05 DIAGNOSIS — Z7951 Long term (current) use of inhaled steroids: Secondary | ICD-10-CM

## 2018-01-05 DIAGNOSIS — K381 Appendicular concretions: Secondary | ICD-10-CM | POA: Diagnosis present

## 2018-01-05 DIAGNOSIS — Z8249 Family history of ischemic heart disease and other diseases of the circulatory system: Secondary | ICD-10-CM

## 2018-01-05 NOTE — ED Triage Notes (Signed)
Patient ambulatory to triage with steady gait, without difficulty or distress noted; pt reports having generalized abd pain since 3am with some dysuria; denies hx of same

## 2018-01-06 ENCOUNTER — Observation Stay: Payer: Managed Care, Other (non HMO) | Admitting: Certified Registered"

## 2018-01-06 ENCOUNTER — Encounter
Admission: EM | Disposition: A | Discharge status: Home / Self Care | Payer: Self-pay | Source: Home / Self Care | Attending: Surgery

## 2018-01-06 ENCOUNTER — Inpatient Hospital Stay
Admission: EM | Admit: 2018-01-06 | Discharge: 2018-01-08 | DRG: 340 | Disposition: A | Discharge status: Home / Self Care | Payer: Managed Care, Other (non HMO) | Attending: Surgery | Admitting: Surgery

## 2018-01-06 ENCOUNTER — Emergency Department: Payer: Managed Care, Other (non HMO)

## 2018-01-06 ENCOUNTER — Encounter: Payer: Self-pay | Admitting: Radiology

## 2018-01-06 ENCOUNTER — Other Ambulatory Visit: Payer: Self-pay

## 2018-01-06 DIAGNOSIS — K429 Umbilical hernia without obstruction or gangrene: Secondary | ICD-10-CM | POA: Diagnosis present

## 2018-01-06 DIAGNOSIS — K3589 Other acute appendicitis without perforation or gangrene: Secondary | ICD-10-CM

## 2018-01-06 DIAGNOSIS — K3580 Unspecified acute appendicitis: Secondary | ICD-10-CM | POA: Diagnosis not present

## 2018-01-06 DIAGNOSIS — Z79899 Other long term (current) drug therapy: Secondary | ICD-10-CM | POA: Diagnosis not present

## 2018-01-06 DIAGNOSIS — K3532 Acute appendicitis with perforation and localized peritonitis, without abscess: Secondary | ICD-10-CM | POA: Diagnosis present

## 2018-01-06 DIAGNOSIS — I10 Essential (primary) hypertension: Secondary | ICD-10-CM | POA: Diagnosis present

## 2018-01-06 DIAGNOSIS — Z8249 Family history of ischemic heart disease and other diseases of the circulatory system: Secondary | ICD-10-CM | POA: Diagnosis not present

## 2018-01-06 DIAGNOSIS — Z885 Allergy status to narcotic agent status: Secondary | ICD-10-CM | POA: Diagnosis not present

## 2018-01-06 DIAGNOSIS — Z888 Allergy status to other drugs, medicaments and biological substances status: Secondary | ICD-10-CM | POA: Diagnosis not present

## 2018-01-06 DIAGNOSIS — R1031 Right lower quadrant pain: Secondary | ICD-10-CM | POA: Diagnosis present

## 2018-01-06 DIAGNOSIS — K381 Appendicular concretions: Secondary | ICD-10-CM | POA: Diagnosis present

## 2018-01-06 DIAGNOSIS — Z7951 Long term (current) use of inhaled steroids: Secondary | ICD-10-CM | POA: Diagnosis not present

## 2018-01-06 DIAGNOSIS — Z87891 Personal history of nicotine dependence: Secondary | ICD-10-CM | POA: Diagnosis not present

## 2018-01-06 DIAGNOSIS — K37 Unspecified appendicitis: Secondary | ICD-10-CM | POA: Diagnosis present

## 2018-01-06 HISTORY — PX: LAPAROSCOPIC APPENDECTOMY: SHX408

## 2018-01-06 LAB — COMPREHENSIVE METABOLIC PANEL
ALT: 22 U/L (ref 0–44)
AST: 20 U/L (ref 15–41)
Albumin: 4.9 g/dL (ref 3.5–5.0)
Alkaline Phosphatase: 74 U/L (ref 38–126)
Anion gap: 10 (ref 5–15)
BILIRUBIN TOTAL: 1.1 mg/dL (ref 0.3–1.2)
BUN: 12 mg/dL (ref 6–20)
CHLORIDE: 104 mmol/L (ref 98–111)
CO2: 26 mmol/L (ref 22–32)
Calcium: 9.4 mg/dL (ref 8.9–10.3)
Creatinine, Ser: 0.85 mg/dL (ref 0.61–1.24)
GFR calc Af Amer: >60 mL/min (ref >60)
GFR calc non Af Amer: >60 mL/min (ref >60)
Glucose, Bld: 120 mg/dL — ABNORMAL HIGH (ref 70–99)
POTASSIUM: 3.7 mmol/L (ref 3.5–5.1)
Sodium: 140 mmol/L (ref 135–145)
TOTAL PROTEIN: 8.4 g/dL — AB (ref 6.5–8.1)

## 2018-01-06 LAB — URINALYSIS, COMPLETE (UACMP) WITH MICROSCOPIC
BACTERIA UA: NONE SEEN
BILIRUBIN URINE: NEGATIVE
Glucose, UA: NEGATIVE mg/dL
HGB URINE DIPSTICK: NEGATIVE
KETONES UR: 5 mg/dL — AB
LEUKOCYTES UA: NEGATIVE
NITRITE: NEGATIVE
Protein, ur: NEGATIVE mg/dL
Specific Gravity, Urine: 1.018 (ref 1.005–1.030)
pH: 6 (ref 5.0–8.0)

## 2018-01-06 LAB — LIPASE, BLOOD: Lipase: 25 U/L (ref 11–51)

## 2018-01-06 LAB — CBC WITH DIFFERENTIAL/PLATELET
Basophils Absolute: 0 10*3/uL (ref 0–0.1)
Basophils Relative: 0 %
EOS ABS: 0 10*3/uL (ref 0–0.7)
Eosinophils Relative: 0 %
HEMATOCRIT: 43.6 % (ref 40.0–52.0)
Hemoglobin: 15.4 g/dL (ref 13.0–18.0)
Lymphocytes Relative: 7 %
Lymphs Abs: 1.2 10*3/uL (ref 1.0–3.6)
MCH: 31.7 pg (ref 26.0–34.0)
MCHC: 35.3 g/dL (ref 32.0–36.0)
MCV: 89.8 fL (ref 80.0–100.0)
MONO ABS: 0.9 10*3/uL (ref 0.2–1.0)
Monocytes Relative: 6 %
NEUTROS ABS: 13.9 10*3/uL — AB (ref 1.4–6.5)
NEUTROS PCT: 87 %
Platelets: 267 10*3/uL (ref 150–440)
RBC: 4.86 MIL/uL (ref 4.40–5.90)
RDW: 12.9 % (ref 11.5–14.5)
WBC: 16 10*3/uL — ABNORMAL HIGH (ref 3.8–10.6)

## 2018-01-06 LAB — SURGICAL PCR SCREEN
MRSA, PCR: NEGATIVE
Staphylococcus aureus: NEGATIVE

## 2018-01-06 LAB — TROPONIN I

## 2018-01-06 SURGERY — APPENDECTOMY, LAPAROSCOPIC
Anesthesia: General | Wound class: Dirty or Infected

## 2018-01-06 MED ORDER — ACETAMINOPHEN 10 MG/ML IV SOLN
INTRAVENOUS | Status: AC
  Filled 2018-01-06: qty 100

## 2018-01-06 MED ORDER — KETOROLAC TROMETHAMINE 30 MG/ML IJ SOLN
INTRAMUSCULAR | Status: DC | PRN
  Administered 2018-01-06: 30 mg via INTRAVENOUS

## 2018-01-06 MED ORDER — FENTANYL CITRATE (PF) 100 MCG/2ML IJ SOLN: 25.0000 ug | INTRAMUSCULAR | Status: DC | PRN

## 2018-01-06 MED ORDER — FENTANYL CITRATE (PF) 100 MCG/2ML IJ SOLN
INTRAMUSCULAR | Status: DC | PRN
  Administered 2018-01-06 (×5): 50 ug via INTRAVENOUS

## 2018-01-06 MED ORDER — ACETAMINOPHEN 10 MG/ML IV SOLN
INTRAVENOUS | Status: DC | PRN
  Administered 2018-01-06: 1000 mg via INTRAVENOUS

## 2018-01-06 MED ORDER — BUPIVACAINE-EPINEPHRINE 0.25% -1:200000 IJ SOLN
INTRAMUSCULAR | Status: DC | PRN
  Administered 2018-01-06: 30 mL

## 2018-01-06 MED ORDER — SODIUM CHLORIDE 0.9 % IV BOLUS
1000.0000 mL | Freq: Once | INTRAVENOUS | Status: AC
  Administered 2018-01-06: 1000 mL via INTRAVENOUS

## 2018-01-06 MED ORDER — HEPARIN SODIUM (PORCINE) 5000 UNIT/ML IJ SOLN
5000.0000 [IU] | Freq: Three times a day (TID) | INTRAMUSCULAR | Status: DC
  Administered 2018-01-06 – 2018-01-08 (×5): 5000 [IU] via SUBCUTANEOUS
  Filled 2018-01-06 (×5): qty 1

## 2018-01-06 MED ORDER — MORPHINE SULFATE (PF) 4 MG/ML IV SOLN
4.0000 mg | INTRAVENOUS | Status: DC | PRN
  Administered 2018-01-06 – 2018-01-07 (×2): 4 mg via INTRAVENOUS
  Filled 2018-01-06 (×2): qty 1

## 2018-01-06 MED ORDER — FENTANYL CITRATE (PF) 100 MCG/2ML IJ SOLN
INTRAMUSCULAR | Status: AC
  Filled 2018-01-06: qty 2

## 2018-01-06 MED ORDER — SODIUM CHLORIDE 0.9 % IV SOLN
1.0000 g | Freq: Once | INTRAVENOUS | Status: AC
  Administered 2018-01-06: 1 g via INTRAVENOUS
  Filled 2018-01-06: qty 10

## 2018-01-06 MED ORDER — PROMETHAZINE HCL 25 MG/ML IJ SOLN: 6.2500 mg | INTRAMUSCULAR | Status: DC | PRN

## 2018-01-06 MED ORDER — METRONIDAZOLE IN NACL 5-0.79 MG/ML-% IV SOLN
500.0000 mg | Freq: Once | INTRAVENOUS | Status: DC
  Administered 2018-01-06: 500 mg via INTRAVENOUS
  Filled 2018-01-06: qty 100

## 2018-01-06 MED ORDER — PIPERACILLIN-TAZOBACTAM 3.375 G IVPB
3.3750 g | Freq: Three times a day (TID) | INTRAVENOUS | Status: DC
  Administered 2018-01-06 – 2018-01-08 (×6): 3.375 g via INTRAVENOUS
  Filled 2018-01-06 (×5): qty 50

## 2018-01-06 MED ORDER — OXYCODONE HCL 5 MG PO TABS
5.0000 mg | ORAL_TABLET | ORAL | Status: DC | PRN
  Filled 2018-01-06: qty 2
  Filled 2018-01-06: qty 1

## 2018-01-06 MED ORDER — IOHEXOL 300 MG/ML  SOLN
75.0000 mL | Freq: Once | INTRAMUSCULAR | Status: AC | PRN
  Administered 2018-01-06: 75 mL via INTRAVENOUS

## 2018-01-06 MED ORDER — PROPOFOL 10 MG/ML IV BOLUS
INTRAVENOUS | Status: AC
  Filled 2018-01-06: qty 20

## 2018-01-06 MED ORDER — BUPIVACAINE-EPINEPHRINE (PF) 0.25% -1:200000 IJ SOLN
INTRAMUSCULAR | Status: AC
  Filled 2018-01-06: qty 30

## 2018-01-06 MED ORDER — SUCCINYLCHOLINE CHLORIDE 20 MG/ML IJ SOLN
INTRAMUSCULAR | Status: DC | PRN
  Administered 2018-01-06: 100 mg via INTRAVENOUS

## 2018-01-06 MED ORDER — LIDOCAINE HCL (CARDIAC) PF 100 MG/5ML IV SOSY
PREFILLED_SYRINGE | INTRAVENOUS | Status: DC | PRN
  Administered 2018-01-06: 100 mg via INTRAVENOUS

## 2018-01-06 MED ORDER — ONDANSETRON 4 MG PO TBDP: 4.0000 mg | ORAL_TABLET | Freq: Four times a day (QID) | ORAL | Status: DC | PRN

## 2018-01-06 MED ORDER — KETOROLAC TROMETHAMINE 30 MG/ML IJ SOLN
INTRAMUSCULAR | Status: AC
  Filled 2018-01-06: qty 1

## 2018-01-06 MED ORDER — SUGAMMADEX SODIUM 200 MG/2ML IV SOLN
INTRAVENOUS | Status: AC
  Filled 2018-01-06: qty 2

## 2018-01-06 MED ORDER — ROCURONIUM BROMIDE 50 MG/5ML IV SOLN
INTRAVENOUS | Status: AC
  Filled 2018-01-06: qty 1

## 2018-01-06 MED ORDER — KETOROLAC TROMETHAMINE 30 MG/ML IJ SOLN
30.0000 mg | Freq: Four times a day (QID) | INTRAMUSCULAR | Status: DC
  Administered 2018-01-06 – 2018-01-08 (×8): 30 mg via INTRAVENOUS
  Filled 2018-01-06 (×8): qty 1

## 2018-01-06 MED ORDER — KETOROLAC TROMETHAMINE 30 MG/ML IJ SOLN
15.0000 mg | Freq: Once | INTRAMUSCULAR | Status: AC
  Administered 2018-01-06: 15 mg via INTRAVENOUS
  Filled 2018-01-06: qty 1

## 2018-01-06 MED ORDER — ROCURONIUM BROMIDE 100 MG/10ML IV SOLN
INTRAVENOUS | Status: DC | PRN
  Administered 2018-01-06: 5 mg via INTRAVENOUS
  Administered 2018-01-06: 10 mg via INTRAVENOUS
  Administered 2018-01-06: 35 mg via INTRAVENOUS
  Administered 2018-01-06: 10 mg via INTRAVENOUS

## 2018-01-06 MED ORDER — ONDANSETRON HCL 4 MG/2ML IJ SOLN
INTRAMUSCULAR | Status: AC
  Filled 2018-01-06: qty 2

## 2018-01-06 MED ORDER — SUGAMMADEX SODIUM 200 MG/2ML IV SOLN
INTRAVENOUS | Status: DC | PRN
  Administered 2018-01-06: 200 mg via INTRAVENOUS

## 2018-01-06 MED ORDER — LIDOCAINE HCL (PF) 2 % IJ SOLN
INTRAMUSCULAR | Status: AC
  Filled 2018-01-06: qty 10

## 2018-01-06 MED ORDER — MIDAZOLAM HCL 2 MG/2ML IJ SOLN
INTRAMUSCULAR | Status: AC
  Filled 2018-01-06: qty 2

## 2018-01-06 MED ORDER — ONDANSETRON HCL 4 MG/2ML IJ SOLN: 4.0000 mg | Freq: Four times a day (QID) | INTRAMUSCULAR | Status: DC | PRN

## 2018-01-06 MED ORDER — PROPOFOL 10 MG/ML IV BOLUS
INTRAVENOUS | Status: DC | PRN
  Administered 2018-01-06: 200 mg via INTRAVENOUS

## 2018-01-06 MED ORDER — KETOROLAC TROMETHAMINE 30 MG/ML IJ SOLN
30.0000 mg | Freq: Four times a day (QID) | INTRAMUSCULAR | Status: DC | PRN
  Administered 2018-01-06: 30 mg via INTRAVENOUS
  Filled 2018-01-06: qty 1

## 2018-01-06 MED ORDER — DEXAMETHASONE SODIUM PHOSPHATE 10 MG/ML IJ SOLN
INTRAMUSCULAR | Status: AC
Start: 2018-01-06 — End: ?
  Filled 2018-01-06: qty 1

## 2018-01-06 MED ORDER — ONDANSETRON HCL 4 MG/2ML IJ SOLN
INTRAMUSCULAR | Status: DC | PRN
  Administered 2018-01-06: 4 mg via INTRAVENOUS

## 2018-01-06 MED ORDER — FENTANYL CITRATE (PF) 100 MCG/2ML IJ SOLN
100.0000 ug | Freq: Once | INTRAMUSCULAR | Status: AC
  Administered 2018-01-06: 100 ug via INTRAVENOUS
  Filled 2018-01-06: qty 2

## 2018-01-06 MED ORDER — HYDRALAZINE HCL 20 MG/ML IJ SOLN: 10.0000 mg | INTRAMUSCULAR | Status: DC | PRN

## 2018-01-06 MED ORDER — MIDAZOLAM HCL 2 MG/2ML IJ SOLN
INTRAMUSCULAR | Status: DC | PRN
  Administered 2018-01-06: 2 mg via INTRAVENOUS

## 2018-01-06 MED ORDER — LACTATED RINGERS IV SOLN
INTRAVENOUS | Status: DC
  Administered 2018-01-06 – 2018-01-08 (×6): via INTRAVENOUS

## 2018-01-06 MED ORDER — DEXAMETHASONE SODIUM PHOSPHATE 10 MG/ML IJ SOLN
INTRAMUSCULAR | Status: DC | PRN
  Administered 2018-01-06: 8 mg via INTRAVENOUS

## 2018-01-06 MED ORDER — KETOROLAC TROMETHAMINE 30 MG/ML IJ SOLN: 30.0000 mg | Freq: Four times a day (QID) | INTRAMUSCULAR | Status: DC | PRN

## 2018-01-06 MED ORDER — PIPERACILLIN-TAZOBACTAM 3.375 G IVPB
INTRAVENOUS | Status: AC
  Filled 2018-01-06: qty 50

## 2018-01-06 MED ORDER — ACETAMINOPHEN 500 MG PO TABS
1000.0000 mg | ORAL_TABLET | Freq: Once | ORAL | Status: AC
  Administered 2018-01-06: 1000 mg via ORAL
  Filled 2018-01-06: qty 2

## 2018-01-06 MED ORDER — ACETAMINOPHEN 500 MG PO TABS
1000.0000 mg | ORAL_TABLET | Freq: Four times a day (QID) | ORAL | Status: DC
  Administered 2018-01-06 – 2018-01-08 (×7): 1000 mg via ORAL
  Filled 2018-01-06 (×6): qty 2

## 2018-01-06 MED ORDER — ONDANSETRON 4 MG PO TBDP
4.0000 mg | ORAL_TABLET | Freq: Once | ORAL | Status: AC
  Administered 2018-01-06: 4 mg via ORAL
  Filled 2018-01-06: qty 1

## 2018-01-06 SURGICAL SUPPLY — 38 items
APPLIER CLIP 5 13 M/L LIGAMAX5 (MISCELLANEOUS) ×3
BLADE CLIPPER SURG (BLADE) ×3 IMPLANT
BULB RESERV EVAC DRAIN JP 100C (MISCELLANEOUS) ×3 IMPLANT
CANISTER SUCT 1200ML W/VALVE (MISCELLANEOUS) ×3 IMPLANT
CHLORAPREP W/TINT 26ML (MISCELLANEOUS) ×3 IMPLANT
CLIP APPLIE 5 13 M/L LIGAMAX5 (MISCELLANEOUS) ×1 IMPLANT
CUTTER FLEX LINEAR 45M (STAPLE) ×3 IMPLANT
DERMABOND ADVANCED (GAUZE/BANDAGES/DRESSINGS) ×2
DERMABOND ADVANCED .7 DNX12 (GAUZE/BANDAGES/DRESSINGS) ×1 IMPLANT
DRAIN CHANNEL JP 15F RND 16 (MISCELLANEOUS) ×3 IMPLANT
ELECT CAUTERY BLADE 6.4 (BLADE) ×3 IMPLANT
ELECT REM PT RETURN 9FT ADLT (ELECTROSURGICAL) ×3
ELECTRODE REM PT RTRN 9FT ADLT (ELECTROSURGICAL) ×1 IMPLANT
GLOVE BIO SURGEON STRL SZ7 (GLOVE) ×3 IMPLANT
GOWN STRL REUS W/ TWL LRG LVL3 (GOWN DISPOSABLE) ×2 IMPLANT
GOWN STRL REUS W/TWL LRG LVL3 (GOWN DISPOSABLE) ×4
IRRIGATION STRYKERFLOW (MISCELLANEOUS) ×1 IMPLANT
IRRIGATOR STRYKERFLOW (MISCELLANEOUS) ×3
IV NS 1000ML (IV SOLUTION) ×2
IV NS 1000ML BAXH (IV SOLUTION) ×1 IMPLANT
NEEDLE HYPO 22GX1.5 SAFETY (NEEDLE) ×3 IMPLANT
NS IRRIG 500ML POUR BTL (IV SOLUTION) ×3 IMPLANT
PACK LAP CHOLECYSTECTOMY (MISCELLANEOUS) ×3 IMPLANT
PENCIL ELECTRO HAND CTR (MISCELLANEOUS) ×3 IMPLANT
POUCH SPECIMEN RETRIEVAL 10MM (ENDOMECHANICALS) ×3 IMPLANT
RELOAD 45 VASCULAR/THIN (ENDOMECHANICALS) ×3 IMPLANT
RELOAD STAPLE TA45 3.5 REG BLU (ENDOMECHANICALS) ×3 IMPLANT
SCISSORS METZENBAUM CVD 33 (INSTRUMENTS) ×3 IMPLANT
SHEARS HARMONIC ACE PLUS 36CM (ENDOMECHANICALS) ×3 IMPLANT
SLEEVE ENDOPATH XCEL 5M (ENDOMECHANICALS) ×3 IMPLANT
SPONGE LAP 18X18 RF (DISPOSABLE) ×3 IMPLANT
SUT MNCRL AB 4-0 PS2 18 (SUTURE) ×3 IMPLANT
SUT VICRYL 0 AB UR-6 (SUTURE) ×6 IMPLANT
SYR 20CC LL (SYRINGE) ×3 IMPLANT
TRAY FOLEY MTR SLVR 16FR STAT (SET/KITS/TRAYS/PACK) ×3 IMPLANT
TROCAR XCEL BLUNT TIP 100MML (ENDOMECHANICALS) ×3 IMPLANT
TROCAR XCEL NON-BLD 5MMX100MML (ENDOMECHANICALS) ×6 IMPLANT
TUBING INSUF HEATED (TUBING) ×3 IMPLANT

## 2018-01-06 NOTE — Anesthesia Procedure Notes (Signed)
Procedure Name: Intubation Performed by: Lance Muss, CRNA Pre-anesthesia Checklist: Patient identified, Patient being monitored, Timeout performed, Emergency Drugs available and Suction available Patient Re-evaluated:Patient Re-evaluated prior to induction Oxygen Delivery Method: Circle system utilized Preoxygenation: Pre-oxygenation with 100% oxygen Induction Type: IV induction Ventilation: Mask ventilation without difficulty Laryngoscope Size: Mac and 3 Grade View: Grade II Tube type: Oral Tube size: 7.5 mm Number of attempts: 1 Airway Equipment and Method: Stylet Placement Confirmation: ETT inserted through vocal cords under direct vision,  positive ETCO2 and breath sounds checked- equal and bilateral Secured at: 21 cm Tube secured with: Tape Dental Injury: Teeth and Oropharynx as per pre-operative assessment

## 2018-01-06 NOTE — Op Note (Signed)
laparascopic appendectomy and Umbilical hernia repair  Chris Caprionald Keith Lucia EstelleMinter Jr. Date of operation:  01/06/2018  Indications: The patient presented with a history of  abdominal pain. Workup has revealed findings consistent with acute appendicitis.  Pre-operative Diagnosis: Acute appendicitis without mention of peritonitis  Post-operative Diagnosis: Same  Surgeon: Sterling Bigiego Pabon, MD, FACS  Anesthesia: General with endotracheal tube  Findings: Acute contained perforated appendicitis, Gangrenous appendicitis Reducible umbilical hernia  Estimated Blood Loss: 10cc         Specimens: appendix         Complications:  none  Procedure Details  The patient was seen again in the preop area. The options of surgery versus observation were reviewed with the patient and/or family. The risks of bleeding, infection, recurrence of symptoms, negative laparoscopy, potential for an open procedure, bowel injury, abscess or infection, were all reviewed as well. The patient was taken to Operating Room, identified as Chris HolidayRonald Keith Reta Jr. and the procedure verified as laparoscopic appendectomy. A Time Out was held and the above information confirmed.  The patient was placed in the supine position and general anesthesia was induced.  Antibiotic prophylaxis was administered and VT E prophylaxis was in place.  The abdomen was prepped and draped in a sterile fashion. An infraumbilical incision was made and a hernia identified, hernia sac was excised and the fascial edges were cleaned.. A cutdown technique was used to enter the abdominal cavity. Two vicryl stitches were placed on the fascia and a Hasson trocar inserted. Pneumoperitoneum obtained. Two 5 mm ports were placed under direct visualization.   The appendix was identified and found to be necrotic and with a contained perforation. The appendix was carefully dissected. The mesoappendix was divided withHarmonic scalpel. The base of the appendix was dissected out  and divided with a standard load Endo GIA.The appendix was placed in a Endo Catch bag and removed via the Hasson port. The right lower quadrant and pelvis was then irrigated with  normal saline which was aspirated. Inspection  failed to identify any additional bleeding and there were no signs of bowel injury. Again the right lower quadrant was inspected there was no sign of bleeding or bowel injury therefore pneumoperitoneum was released, all ports were removed. 15 Fr blake drain place RLQ and secured with 3-0 nylon.  The umbilical defecet was closed with 0 Vicryl interrupted sutures and the skin incisions were approximated with subcuticular 4-0 Monocryl. Dermabond was placed The patient tolerated the procedure well, there were no complications. The sponge lap and needle count were correct at the end of the procedure.  The patient was taken to the recovery room in stable condition to be admitted for continued care.    Sterling Bigiego Pabon, MD FACS

## 2018-01-06 NOTE — Progress Notes (Signed)
15 minute call to floor. 

## 2018-01-06 NOTE — Anesthesia Preprocedure Evaluation (Signed)
Anesthesia Evaluation  Patient identified by MRN, date of birth, ID band Patient awake    Reviewed: Allergy & Precautions, H&P , NPO status , Patient's Chart, lab work & pertinent test results, reviewed documented beta blocker date and time   History of Anesthesia Complications Negative for: history of anesthetic complications  Airway Mallampati: IV  TM Distance: >3 FB Neck ROM: full    Dental  (+) Dental Advidsory Given, Teeth Intact, Chipped   Pulmonary neg pulmonary ROS, former smoker,           Cardiovascular Exercise Tolerance: Good hypertension, (-) angina(-) CAD, (-) Past MI, (-) Cardiac Stents and (-) CABG (-) dysrhythmias (-) Valvular Problems/Murmurs     Neuro/Psych negative neurological ROS  negative psych ROS   GI/Hepatic Neg liver ROS, GERD  ,  Endo/Other  negative endocrine ROS  Renal/GU negative Renal ROS  negative genitourinary   Musculoskeletal   Abdominal   Peds  Hematology negative hematology ROS (+)   Anesthesia Other Findings Past Medical History: No date: Hypertension   Reproductive/Obstetrics negative OB ROS                             Anesthesia Physical Anesthesia Plan  ASA: II  Anesthesia Plan: General   Post-op Pain Management:    Induction: Intravenous  PONV Risk Score and Plan: 2 and Ondansetron and Dexamethasone  Airway Management Planned: Oral ETT  Additional Equipment:   Intra-op Plan:   Post-operative Plan: Extubation in OR  Informed Consent: I have reviewed the patients History and Physical, chart, labs and discussed the procedure including the risks, benefits and alternatives for the proposed anesthesia with the patient or authorized representative who has indicated his/her understanding and acceptance.   Dental Advisory Given  Plan Discussed with: Anesthesiologist, CRNA and Surgeon  Anesthesia Plan Comments:         Anesthesia  Quick Evaluation

## 2018-01-06 NOTE — ED Provider Notes (Signed)
[facility] Emergency Department Provider Note  ____________________________________________   First MD Initiated Contact with Patient 01/06/18 0335     (approximate)  I have reviewed the triage vital signs and the nursing notes.   HISTORY  Chief Complaint Abdominal Pain   HPI Chris Griffith. is a 42 y.o. male who self presents to the emergency department with 1 day of lower abdominal pain.  He said about 2 days of decreased appetite and some nausea and then 1 day of moderate severity throbbing aching sharp lower abdominal pain.  It is in his lower mid abdomen radiating across midline just to the left.  He has no history of abdominal surgeries aside from a remote umbilical hernia repair.  He has had no diarrhea.  No vomiting.  No fevers or chills.  No back pain.  No dysuria.  Nothing seems to make his pain better or worse.    Past Medical History:  Diagnosis Date  . Hypertension     Patient Active Problem List   Diagnosis Date Noted  . Appendicitis 01/06/2018  . Umbilical hernia without obstruction and without gangrene   . HTN (hypertension) 01/14/2013    Past Surgical History:  Procedure Laterality Date  . HERNIA REPAIR    . LAPAROSCOPIC APPENDECTOMY N/A 01/06/2018   Procedure: APPENDECTOMY LAPAROSCOPIC;  Surgeon: Leafy Ro, MD;  Location: [facility] ORS;  Service: General;  Laterality: N/A;  . VASECTOMY  10/2012    Prior to Admission medications   Medication Sig Start Date End Date Taking? Authorizing Provider  acetaminophen (TYLENOL) 500 MG tablet Take 500 mg by mouth every 6 (six) hours as needed.   Yes [provider]  fluticasone (FLONASE) 50 MCG/ACT nasal spray Place 2 sprays into both nostrils daily.   Yes [provider]  ibuprofen (ADVIL,MOTRIN) 200 MG tablet Take 200 mg by mouth every 6 (six) hours as needed.   Yes [provider]  amoxicillin-clavulanate (AUGMENTIN) 875-125 MG tablet Take 1 tablet by  mouth 2 (two) times daily for 10 days. 01/08/18 01/18/18  Pabon, Merri Ray, MD  HYDROcodone-acetaminophen (NORCO/VICODIN) 5-325 MG tablet Take 1-2 tablets by mouth every 4 (four) hours as needed for moderate pain. 01/08/18   Pabon, Merri Ray, MD    Allergies Vasotec [enalapril]; Verapamil; and Codeine  Family History  Problem Relation Age of Onset  . Heart failure Mother   . Heart failure Father     Social History Social History   Tobacco Use  . Smoking status: Former Games developer  . Smokeless tobacco: Former Neurosurgeon    Quit date: 08/16/1995  Substance Use Topics  . Alcohol use: No    Alcohol/week: 0.0 oz  . Drug use: No    Review of Systems Constitutional: No fever/chills Eyes: No visual changes. ENT: No sore throat. Cardiovascular: Denies chest pain. Respiratory: Denies shortness of breath. Gastrointestinal: Positive for abdominal pain.  Positive for nausea, no vomiting.  No diarrhea.  No constipation. Genitourinary: Negative for dysuria. Musculoskeletal: Negative for back pain. Skin: Negative for rash. Neurological: Negative for headaches, focal weakness or numbness.   ____________________________________________   PHYSICAL EXAM:  VITAL SIGNS: ED Triage Vitals  Enc Vitals Group     BP 01/05/18 2343 (!) 171/106     Pulse Rate 01/05/18 2343 79     Resp 01/05/18 2343 19     Temp 01/05/18 2343 99.6 F (37.6 C)     Temp Source 01/05/18 2343 Oral     SpO2 01/05/18 2343  98 %     Weight 01/05/18 2340 125 lb (56.7 kg)     Height 01/05/18 2340 5\' 9"  (1.753 m)     Head Circumference --      Peak Flow --      Pain Score 01/05/18 2340 7     Pain Loc --      Pain Edu? --      Excl. in GC? --     Constitutional: Alert and oriented x4 obviously uncomfortable holding his abdomen groaning in pain Eyes: PERRL EOMI. Head: Atraumatic. Nose: No congestion/rhinnorhea. Mouth/Throat: No trismus Neck: No stridor.   Cardiovascular: Normal rate, regular rhythm. Grossly normal heart  sounds.  Good peripheral circulation. Respiratory: Normal respiratory effort.  No retractions. Lungs CTAB and moving good air Gastrointestinal: Quite tender right lower quadrant with localized rebound and local peritonitis.  Tender over McBurney's point.  No Rovsing's Musculoskeletal: No lower extremity edema   Neurologic:  Normal speech and language. No gross focal neurologic deficits are appreciated. Skin:  Skin is warm, dry and intact. No rash noted. Psychiatric: Mood and affect are normal. Speech and behavior are normal.    ____________________________________________   DIFFERENTIAL includes but not limited to  Appendicitis, bowel obstruction, diverticulitis, nephrolithiasis ____________________________________________   LABS (all labs ordered are listed, but only abnormal results are displayed)  Labs Reviewed  CBC WITH DIFFERENTIAL/PLATELET - Abnormal; Notable for the following components:      Result Value   WBC 16.0 (*)    Neutro Abs 13.9 (*)    All other components within normal limits  COMPREHENSIVE METABOLIC PANEL - Abnormal; Notable for the following components:   Glucose, Bld 120 (*)    Total Protein 8.4 (*)    All other components within normal limits  URINALYSIS, COMPLETE (UACMP) WITH MICROSCOPIC - Abnormal; Notable for the following components:   Color, Urine YELLOW (*)    APPearance CLEAR (*)    Ketones, ur 5 (*)    All other components within normal limits  BASIC METABOLIC PANEL - Abnormal; Notable for the following components:   Glucose, Bld 130 (*)    Calcium 8.5 (*)    All other components within normal limits  CBC - Abnormal; Notable for the following components:   WBC 18.0 (*)    RBC 4.14 (*)    HCT 37.6 (*)    All other components within normal limits  CBC - Abnormal; Notable for the following components:   WBC 12.2 (*)    RBC 4.18 (*)    HCT 37.8 (*)    All other components within normal limits  BASIC METABOLIC PANEL - Abnormal; Notable for the  following components:   Potassium 3.4 (*)    Glucose, Bld 117 (*)    Calcium 8.4 (*)    All other components within normal limits  SURGICAL PCR SCREEN  LIPASE, BLOOD  TROPONIN I  HIV ANTIBODY (ROUTINE TESTING)  SURGICAL PATHOLOGY    Lab work reviewed by me shows elevated white count concerning for infection __________________________________________  EKG   ____________________________________________  RADIOLOGY  CT abdomen pelvis reviewed by me consistent with acute nonperforated appendicitis ____________________________________________   PROCEDURES  Procedure(s) performed: no  Procedures  Critical Care performed: no  ____________________________________________   INITIAL IMPRESSION / ASSESSMENT AND PLAN / ED COURSE  Pertinent labs & imaging results that were available during my care of the patient were reviewed by me and considered in my medical decision making (see chart for details).   The patient  arrives obviously uncomfortable appearing with lower abdominal pain and right lower quadrant rebound raising high clinical suspicion for acute appendicitis.  CT scan with IV contrast is pending along with IV fentanyl for pain.  The patient's pain is improved after fentanyl and the CT scan shows acute nonperforated appendicitis.  I spoke with on-call general surgeon who recommends IV antibiotics inpatient admission.  The patient verbalizes understanding agreement with the plan.      ____________________________________________   FINAL CLINICAL IMPRESSION(S) / ED DIAGNOSES  Final diagnoses:  Other acute appendicitis      NEW MEDICATIONS STARTED DURING THIS VISIT:  Current Discharge Medication List    START taking these medications   Details  amoxicillin-clavulanate (AUGMENTIN) 875-125 MG tablet Take 1 tablet by mouth 2 (two) times daily for 10 days. Qty: 14 tablet, Refills: 0    HYDROcodone-acetaminophen (NORCO/VICODIN) 5-325 MG tablet Take 1-2 tablets by  mouth every 4 (four) hours as needed for moderate pain. Qty: 30 tablet, Refills: 0         Note:  This document was prepared using Dragon voice recognition software and may include unintentional dictation errors.     Merrily Brittle, MD 01/08/18 [phone]

## 2018-01-06 NOTE — H&P (Signed)
Patient ID: Chris Holidayonald Keith Insco Jr., male   DOB: 1976/04/23, 42 y.o.   MRN: [id number]  HPI Chris HolidayRonald Keith Edberg Jr. is a 42 y.o. male the 24-hour history of acute onset of abdominal pain.  Patient reports the pain is moderate to severe located in the right lower quadrant and is sharp in nature.  Pain is intermittent and worsening with movement.  Some nausea and decreased appetite.  No fevers no chills. Previous surgery was a right open inguinal hernia repair several years ago.  He is able to perform more than 6 METS of activity without any shortness of breath or chest pain. CT scan personally reviewed there is evidence of a dilated appendix with appendicolith and some fat stranding consistent with appendicitis.  There is no free air there is no perforation or abscess Wbc 16k, creat .8.  HPI  Past Medical History:  Diagnosis Date  . Hypertension     Past Surgical History:  Procedure Laterality Date  . HERNIA REPAIR    . VASECTOMY  10/2012    Family History  Problem Relation Age of Onset  . Heart failure Mother   . Heart failure Father     Social History Social History   Tobacco Use  . Smoking status: Former Games developermoker  . Smokeless tobacco: Former NeurosurgeonUser    Quit date: 08/16/1995  Substance Use Topics  . Alcohol use: No    Alcohol/week: 0.0 oz  . Drug use: No    Allergies  Allergen Reactions  . Vasotec [Enalapril]   . Verapamil   . Codeine Nausea And Vomiting    Current Facility-Administered Medications  Medication Dose Route Frequency Provider Last Rate Last Dose  . heparin injection 5,000 Units  5,000 Units Subcutaneous Q8H Preeya Cleckley F, MD      . ketorolac (TORADOL) 30 MG/ML injection 30 mg  30 mg Intravenous Q6H PRN Jessie Schrieber F, MD   30 mg at 01/06/18 0740  . lactated ringers infusion   Intravenous Continuous Anniebelle Devore F, MD      . morphine 4 MG/ML injection 4 mg  4 mg Intravenous Q3H PRN Zacharey Jensen F, MD      . ondansetron (ZOFRAN-ODT) disintegrating tablet 4 mg   4 mg Oral Q6H PRN Danel Requena F, MD       Or  . ondansetron (ZOFRAN) injection 4 mg  4 mg Intravenous Q6H PRN Alfred Harrel F, MD      . piperacillin-tazobactam (ZOSYN) IVPB 3.375 g  3.375 g Intravenous Q8H Tiauna Whisnant F, MD         Review of Systems Full ROS  was asked and was negative except for the information on the HPI  Physical Exam Blood pressure 128/89, pulse 78, temperature 98.1 F (36.7 C), temperature source Oral, resp. rate 20, height 5\' 9"  (1.753 m), weight 56.7 kg (125 lb), SpO2 98 %. CONSTITUTIONAL: NAD EYES: Pupils are equal, round, and reactive to light, Sclera are non-icteric. EARS, NOSE, MOUTH AND THROAT: The oropharynx is clear. The oral mucosa is pink and moist. Hearing is intact to voice. LYMPH NODES:  Lymph nodes in the neck are normal. RESPIRATORY:  Lungs are clear. There is normal respiratory effort, with equal breath sounds bilaterally, and without pathologic use of accessory muscles. CARDIOVASCULAR: Heart is regular without murmurs, gallops, or rubs. GI: The abdomen is  soft, tender to palpation in the right lower quadrant.  Some focal peritonitis.  Negative for Rovsing sign GU: Rectal deferred.   MUSCULOSKELETAL: Normal muscle  strength and tone. No cyanosis or edema.   SKIN: Turgor is good and there are no pathologic skin lesions or ulcers. NEUROLOGIC: Motor and sensation is grossly normal. Cranial nerves are grossly intact. PSYCH:  Oriented to person, place and time. Affect is normal.  Data Reviewed  I have personally reviewed the patient's imaging, laboratory findings and medical records.    Assessment/Plan 42 year old male with classic signs and symptoms consistent with acute appendicitis confirmed by CT. Is with the patient in detail about his disease process.  Given his age and findings I do recommend proceeding with laparoscopic appendectomy. The risks, benefits, complications, treatment options, and expected outcomes were discussed with the  patient. The treatment of antibiotics alone was discussed giving a 20% chance that this could fail and surgery would be necessary.  Also discussed continuing to the operating room for Laparoscopic Appendectomy.  The possibilities of  bleeding, recurrent infection, perforation of viscus, finding a normal appendix, the need for additional procedures, failure to diagnose a condition, conversion to open procedure and creating a complication requiring transfusion or further operations were discussed. The patient was given the opportunity to ask questions and have them answered.  Patient would like to proceed with Laparoscopic Appendectomy and consent was obtained.  Have started broad-spectrum antibiotic and fluid resuscitation and will post him for later today  Sterling Big, MD FACS General Surgeon 01/06/2018, 8:35 AM

## 2018-01-06 NOTE — Transfer of Care (Signed)
Immediate Anesthesia Transfer of Care Note  Patient: Chris HolidayRonald Keith Stalvey Jr.  Procedure(s) Performed: APPENDECTOMY LAPAROSCOPIC (N/A )  Patient Location: PACU  Anesthesia Type:General  Level of Consciousness: sedated and responds to stimulation  Airway & Oxygen Therapy: Patient Spontanous Breathing and Patient connected to face mask oxygen  Post-op Assessment: Report given to RN and Post -op Vital signs reviewed and stable  Post vital signs: Reviewed and stable  Last Vitals:  Vitals Value Taken Time  BP 132/72 01/06/2018  2:09 PM  Temp 36.8 C 01/06/2018  2:09 PM  Pulse 105 01/06/2018  2:09 PM  Resp 16 01/06/2018  2:09 PM  SpO2 98 % 01/06/2018  2:09 PM    Last Pain:  Vitals:   01/06/18 1147  TempSrc: Tympanic  PainSc: 4          Complications: No apparent anesthesia complications

## 2018-01-06 NOTE — ED Notes (Signed)
Pt to triage for reassessment accomp by SO; pt reports persistent lower abd pain and nausea; pt & SO reports that he "doesn't do well with pain meds"; pt offered tylenol and zofran to ease symptoms and pt agrees; vs retaken; based on acuity will take pt back to next available exam room; pt & SO voice good understanding of plan of care

## 2018-01-06 NOTE — Anesthesia Post-op Follow-up Note (Signed)
Anesthesia QCDR form completed.        

## 2018-01-07 ENCOUNTER — Encounter: Payer: Self-pay | Admitting: Surgery

## 2018-01-07 LAB — CBC
HEMATOCRIT: 37.6 % — AB (ref 40.0–52.0)
HEMOGLOBIN: 13.2 g/dL (ref 13.0–18.0)
MCH: 31.8 pg (ref 26.0–34.0)
MCHC: 35 g/dL (ref 32.0–36.0)
MCV: 90.9 fL (ref 80.0–100.0)
Platelets: 220 10*3/uL (ref 150–440)
RBC: 4.14 MIL/uL — AB (ref 4.40–5.90)
RDW: 13.2 % (ref 11.5–14.5)
WBC: 18 10*3/uL — AB (ref 3.8–10.6)

## 2018-01-07 LAB — BASIC METABOLIC PANEL
Anion gap: 7 (ref 5–15)
BUN: 15 mg/dL (ref 6–20)
CO2: 27 mmol/L (ref 22–32)
Calcium: 8.5 mg/dL — ABNORMAL LOW (ref 8.9–10.3)
Chloride: 104 mmol/L (ref 98–111)
Creatinine, Ser: 1.1 mg/dL (ref 0.61–1.24)
Glucose, Bld: 130 mg/dL — ABNORMAL HIGH (ref 70–99)
POTASSIUM: 3.8 mmol/L (ref 3.5–5.1)
SODIUM: 138 mmol/L (ref 135–145)

## 2018-01-07 MED ORDER — PANTOPRAZOLE SODIUM 40 MG PO TBEC
40.0000 mg | DELAYED_RELEASE_TABLET | Freq: Every day | ORAL | Status: DC
  Administered 2018-01-07 – 2018-01-08 (×2): 40 mg via ORAL
  Filled 2018-01-07 (×2): qty 1

## 2018-01-07 NOTE — Progress Notes (Signed)
POD # 1 Gangrenous appendicitis w contained perforation Febrile yesterday AVSS Taking clears  PE NAD Abd: soft, incisions c/d/i, JP serous fluid  A/p Doing well continue A/bs for gangrenous appendicitis w perforation Mobilize Advance dit slowly as he may develop an ileus

## 2018-01-08 LAB — BASIC METABOLIC PANEL
ANION GAP: 6 (ref 5–15)
BUN: 9 mg/dL (ref 6–20)
CALCIUM: 8.4 mg/dL — AB (ref 8.9–10.3)
CO2: 29 mmol/L (ref 22–32)
CREATININE: 0.83 mg/dL (ref 0.61–1.24)
Chloride: 107 mmol/L (ref 98–111)
GFR calc Af Amer: >60 mL/min (ref >60)
Glucose, Bld: 117 mg/dL — ABNORMAL HIGH (ref 70–99)
Potassium: 3.4 mmol/L — ABNORMAL LOW (ref 3.5–5.1)
Sodium: 142 mmol/L (ref 135–145)

## 2018-01-08 LAB — CBC
HCT: 37.8 % — ABNORMAL LOW (ref 40.0–52.0)
Hemoglobin: 13.2 g/dL (ref 13.0–18.0)
MCH: 31.5 pg (ref 26.0–34.0)
MCHC: 34.8 g/dL (ref 32.0–36.0)
MCV: 90.4 fL (ref 80.0–100.0)
PLATELETS: 218 10*3/uL (ref 150–440)
RBC: 4.18 MIL/uL — ABNORMAL LOW (ref 4.40–5.90)
RDW: 13.1 % (ref 11.5–14.5)
WBC: 12.2 10*3/uL — AB (ref 3.8–10.6)

## 2018-01-08 LAB — HIV ANTIBODY (ROUTINE TESTING W REFLEX): HIV SCREEN 4TH GENERATION: NONREACTIVE

## 2018-01-08 LAB — SURGICAL PATHOLOGY

## 2018-01-08 MED ORDER — AMOXICILLIN-POT CLAVULANATE 875-125 MG PO TABS
1.0000 | ORAL_TABLET | Freq: Two times a day (BID) | ORAL | 0 refills | Status: AC
Start: 2018-01-08 — End: 2018-01-18

## 2018-01-08 MED ORDER — HYDROCODONE-ACETAMINOPHEN 5-325 MG PO TABS: 1.0000 | ORAL_TABLET | ORAL | 0 refills | Status: DC | PRN

## 2018-01-08 MED ORDER — AMOXICILLIN-POT CLAVULANATE 875-125 MG PO TABS: 1.0000 | ORAL_TABLET | Freq: Two times a day (BID) | ORAL | 0 refills | Status: DC

## 2018-01-08 NOTE — Progress Notes (Signed)
Discharge order received. Patient is alert and oriented. Vital signs stable . No signs of acute distress. Discharge instructions given. Patient verbalized understanding. No other issues noted at this time.   

## 2018-01-08 NOTE — Anesthesia Postprocedure Evaluation (Signed)
Anesthesia Post Note  Patient: Betsey HolidayRonald Keith Queener Jr.  Procedure(s) Performed: APPENDECTOMY LAPAROSCOPIC (N/A )  Patient location during evaluation: PACU Anesthesia Type: General Level of consciousness: awake and alert Pain management: pain level controlled Vital Signs Assessment: post-procedure vital signs reviewed and stable Respiratory status: spontaneous breathing, nonlabored ventilation, respiratory function stable and patient connected to nasal cannula oxygen Cardiovascular status: blood pressure returned to baseline and stable Postop Assessment: no apparent nausea or vomiting Anesthetic complications: no     Last Vitals:  Vitals:   01/07/18 2010 01/08/18 0421  BP: (!) 159/90 (!) 155/94  Pulse: 73 79  Resp: 20 18  Temp: 36.8 C 36.8 C  SpO2: 95% 95%    Last Pain:  Vitals:   01/08/18 0421  TempSrc: Oral  PainSc:                  Cleda MccreedyJoseph K Piscitello

## 2018-01-08 NOTE — Discharge Instructions (Signed)
Laparoscopic Appendectomy  °Care After  ° ° °These instructions give you information on caring for yourself after your procedure. Your doctor may also give you more specific instructions. Call your doctor if you have any problems or questions after your procedure.  °HOME CARE  °Do not drive while taking pain medicine (narcotics).  °Take medicine (stool softener) if you cannot poop (constipated).  °Change your bandages (dressings) as told by your doctor.  °Keep your wounds clean and dry. Wash the wounds gently with soap and water. Gently pat the wounds dry with a clean towel.  °Do not take baths, swim, or use hot tubs for 10 days, or as told by your doctor.  °Only take medicine as told by your doctor.  °Continue your normal diet as told by your doctor.  °Do not lift more than 10 pounds (4.5 kilograms) or play contact sports for 3 weeks, or as told by your doctor.  °Slowly increase your activity.  °Take deep breaths to avoid a lung infection (pneumonia). °GET HELP RIGHT AWAY IF:  °You have a fever >101 °You have a rash.  °You have trouble breathing or sharp chest pain.  °You have a reaction to the medicine you are taking.  °Your wound is red, puffy (swollen), or painful.  °You have yellowish-white fluid (pus) coming from the wound.  °You have fluid coming from the wound for longer than 1 day.  °You notice a bad smell coming from the wound or bandage.  °Your wound breaks open after stitches (sutures) or staples are removed.  °You have pain in the shoulders or shoulder blades.   °You are short of breath.  °You feel sick to your stomach (nauseous) or throw up (vomit).  °MAKE SURE YOU:  °Understand these instructions.  °Will watch your condition.  °Will get help right away if you are not doing well or get worse. ° °

## 2018-01-09 NOTE — Discharge Summary (Signed)
Patient ID: Chris Griffith. MRN: [id number] DOB/AGE: [birth year] 42 y.o.  Admit date: 01/06/2018 Discharge date: 01/09/2018   Discharge Diagnoses:  Active Problems:   Appendicitis   Umbilical hernia without obstruction and without gangrene   Procedures: Lap appendectomy  Hospital Course:   42 yo male admitted w appendicitis underwent appendectomy and found necrotic appendix w contained perforation.  HE was admitted for 2 nights. At the time of DC he was doing very well, tolerating diet, AVSS. PE: NAD, alert Abd: soft, incision c/d/i, jp serous drainage. No infection or peritonitis. Ext: no edema and well perfused.Condition at the time of DC was stable.   Disposition:   Discharge Instructions    Call MD for:  difficulty breathing, headache or visual disturbances   Complete by:  As directed    Call MD for:  extreme fatigue   Complete by:  As directed    Call MD for:  hives   Complete by:  As directed    Call MD for:  persistant dizziness or light-headedness   Complete by:  As directed    Call MD for:  persistant nausea and vomiting   Complete by:  As directed    Call MD for:  redness, tenderness, or signs of infection (pain, swelling, redness, odor or green/yellow discharge around incision site)   Complete by:  As directed    Call MD for:  severe uncontrolled pain   Complete by:  As directed    Call MD for:  temperature >100.4   Complete by:  As directed    Change dressing (specify)   Complete by:  As directed    Dressing change: xchange drain dressing daily JP   Diet - low sodium heart healthy   Complete by:  As directed    Discharge instructions   Complete by:  As directed    Please teach pt about JP care   Increase activity slowly   Complete by:  As directed    Lifting restrictions   Complete by:  As directed    Less 20 lbs x 6 wks     Allergies as of 01/08/2018      Reactions   Vasotec [enalapril]    Verapamil    Codeine Nausea And Vomiting       Medication List    TAKE these medications   acetaminophen 500 MG tablet Commonly known as:  TYLENOL Take 500 mg by mouth every 6 (six) hours as needed.   amoxicillin-clavulanate 875-125 MG tablet Commonly known as:  AUGMENTIN Take 1 tablet by mouth 2 (two) times daily for 10 days.   fluticasone 50 MCG/ACT nasal spray Commonly known as:  FLONASE Place 2 sprays into both nostrils daily.   HYDROcodone-acetaminophen 5-325 MG tablet Commonly known as:  NORCO/VICODIN Take 1-2 tablets by mouth every 4 (four) hours as needed for moderate pain.   ibuprofen 200 MG tablet Commonly known as:  ADVIL,MOTRIN Take 200 mg by mouth every 6 (six) hours as needed.            Discharge Care Instructions  (From admission, onward)        Start     Ordered   01/08/18 0000  Change dressing (specify)    Comments:  Dressing change: xchange drain dressing daily JP   01/08/18 0800     Follow-up Information    Leafy Ro, MD On 01/19/2018.   Specialty:  General Surgery Why:  appointment @ 10:15 Contact information: [address]  [address] [place] [address] [phone]            Sterling Bigiego Tye Juarez, MD FACS

## 2018-01-12 ENCOUNTER — Telehealth: Payer: Self-pay | Admitting: Surgery

## 2018-01-12 NOTE — Telephone Encounter (Signed)
Patients calling and is needing a doctors note for work. Please call patient and advise before typing the note.

## 2018-01-12 NOTE — Telephone Encounter (Signed)
Patient called with faxed number [phone] for FMLA paperwork. Faxed and copy was made and placed in scan folder at this time.

## 2018-01-13 ENCOUNTER — Telehealth: Payer: Self-pay | Admitting: Family Medicine

## 2018-01-13 ENCOUNTER — Other Ambulatory Visit: Payer: Self-pay | Admitting: *Deleted

## 2018-01-13 ENCOUNTER — Telehealth: Payer: Self-pay | Admitting: Surgery

## 2018-01-13 MED ORDER — HYDROCORTISONE 2.5 % RE CREA: 1.0000 "application " | TOPICAL_CREAM | Freq: Two times a day (BID) | RECTAL | 0 refills | Status: DC

## 2018-01-13 NOTE — Telephone Encounter (Signed)
Patient's spouse called back needing letter faxed for patients fmla. I have re faxed letter to [phone].

## 2018-01-13 NOTE — Telephone Encounter (Signed)
Patient's spouse called said patient is complaining of having really bad hemorrhoid pain, pain  level being about a five or six, and is asking for something to be called in or does he need to be seen for it, patient denies nausea and vomiting. Please call patient and advise.patient is due to see Dr. Everlene FarrierPabon tomorrow. Patient can be reached at [phone].

## 2018-01-13 NOTE — Telephone Encounter (Signed)
Patient had to have emergency appendectomy surgery on 01/06/18.  He has been having trouble with hemorrhoids since the surgery.  Wife wants to know if something can be called in for him?  [facility] [place] [facility] [address]

## 2018-01-13 NOTE — Patient Outreach (Signed)
[facility] Care Management  01/13/2018  Chris HolidayRonald Keith Salvi Jr. 12/23/1975 [id number]   Subjective: Telephone call to patient's home / mobile number, spoke with patient, and HIPAA verified.  Discussed [facility] Care Management [employer] Transition of care follow up, patient voiced understanding, and is in agreement to follow up.  Patient states he is doing okay and has a follow up appointment with surgeon on 01/14/18.  States wife has gone to pharmacy to pick up new medication, per primary MD's office, and will start medication this afternoon. Patient states he is able to manage self care and has assistance as needed with activities of daily living / home management.   States he is accessing his [employer] benefits as needed via member services number on back of card or through [email or web address].  Patient states he does not have any education material, transition of care, care coordination, disease management, disease monitoring, transportation, community resource, or pharmacy needs at this time. States he is very appreciative of the follow up and is in agreement to receive [facility] Care Management information.      Objective: Per KPN (Knowledge Performance Now, point of care tool), [employer] iCollaborate, and chart review, patient hospitalized 01/06/18 - 01/09/18 for Appendicitis, and Umbilical hernia without obstruction and without gangrene.   Status post laparascopic appendectomy and Umbilical hernia repair on 01/06/18.   Patient also has a history of hypertension.      Assessment:  Received [employer] Transition of care referral on 01/12/18.   Transition of care follow up completed, no care management needs, and will proceed with case closure.      Plan:  RNCM will send patient successful outreach letter, [facility] pamphlet, and magnet. RNCM will complete case closure due to follow up completed / no care management needs.      Bracy Pepper H. Gardiner Barefootooper RN, BSN, CCM [facility] Care Management [facility] Telephonic CM Phone:  [phone] Fax: [phone]

## 2018-01-13 NOTE — Telephone Encounter (Signed)
Med sent to pharm. Pt notified.  

## 2018-01-13 NOTE — Telephone Encounter (Signed)
Patient's spouse is calling again to find something out. Please call and advise.

## 2018-01-13 NOTE — Telephone Encounter (Signed)
anusol hc cream, bid toaffected area

## 2018-01-13 NOTE — Telephone Encounter (Signed)
Called patient back and he stated that for the past two days he started with hemorrhoids. He stated that this is the first time that he has suffered from this. I asked patient if he had been constipated, in the contrary, he had been having loose stools. I told him that eventually he will get better. For his hemorrhoids, I recommended for him to wipe his rectum, put his shower head with warm water on his rectum after every bowel movement and to apply preparation-H. I told patient to try this and to let us know by tomorrow at his post-operation appointment if the hemorrhoids have gotten better or not. That way Dr. Everlene FarrierPabon could recommend or prescribe him something to try for his hemorrhoids. Patient understood and had no further questions.

## 2018-01-14 ENCOUNTER — Ambulatory Visit (INDEPENDENT_AMBULATORY_CARE_PROVIDER_SITE_OTHER): Payer: Managed Care, Other (non HMO) | Admitting: Surgery

## 2018-01-14 ENCOUNTER — Encounter: Payer: Self-pay | Admitting: Surgery

## 2018-01-14 VITALS — BP 177/102 | HR 90 | Temp 98.7°F | Wt 233.0 lb

## 2018-01-14 DIAGNOSIS — Z09 Encounter for follow-up examination after completed treatment for conditions other than malignant neoplasm: Secondary | ICD-10-CM

## 2018-01-14 NOTE — Progress Notes (Signed)
S/p lap appy w necrotic appendix Doing well Main issue is hemorrhoids + PO, no fevers Path d/w pt   PE NAD Abd: incisions healing well, drain removed. No infection Rectal: Internal hemorrhoids, no necrosis, non tender  A/p Doing well Sitz baths BID, High fiber RTC prn

## 2018-01-14 NOTE — Patient Instructions (Signed)

## 2018-01-19 ENCOUNTER — Encounter: Payer: Self-pay | Admitting: Surgery

## 2018-02-04 ENCOUNTER — Ambulatory Visit (HOSPITAL_COMMUNITY)
Admission: RE | Admit: 2018-02-04 | Discharge: 2018-02-04 | Disposition: A | Discharge status: Home / Self Care | Payer: Managed Care, Other (non HMO) | Source: Ambulatory Visit | Attending: Family Medicine | Admitting: Family Medicine

## 2018-02-04 ENCOUNTER — Other Ambulatory Visit (HOSPITAL_COMMUNITY)
Admission: RE | Admit: 2018-02-04 | Discharge: 2018-02-04 | Disposition: A | Discharge status: Home / Self Care | Payer: Managed Care, Other (non HMO) | Source: Ambulatory Visit | Attending: Family Medicine | Admitting: Family Medicine

## 2018-02-04 ENCOUNTER — Other Ambulatory Visit: Payer: Self-pay | Admitting: *Deleted

## 2018-02-04 ENCOUNTER — Ambulatory Visit: Payer: Managed Care, Other (non HMO) | Admitting: Family Medicine

## 2018-02-04 ENCOUNTER — Encounter: Payer: Self-pay | Admitting: Family Medicine

## 2018-02-04 ENCOUNTER — Telehealth: Payer: Self-pay | Admitting: Family Medicine

## 2018-02-04 VITALS — BP 152/98 | Ht 69.0 in | Wt 227.0 lb

## 2018-02-04 DIAGNOSIS — R109 Unspecified abdominal pain: Secondary | ICD-10-CM

## 2018-02-04 DIAGNOSIS — K8020 Calculus of gallbladder without cholecystitis without obstruction: Secondary | ICD-10-CM | POA: Diagnosis not present

## 2018-02-04 DIAGNOSIS — R6883 Chills (without fever): Secondary | ICD-10-CM

## 2018-02-04 DIAGNOSIS — K760 Fatty (change of) liver, not elsewhere classified: Secondary | ICD-10-CM | POA: Insufficient documentation

## 2018-02-04 DIAGNOSIS — R1084 Generalized abdominal pain: Secondary | ICD-10-CM

## 2018-02-04 DIAGNOSIS — R509 Fever, unspecified: Secondary | ICD-10-CM

## 2018-02-04 LAB — BASIC METABOLIC PANEL
ANION GAP: 7 (ref 5–15)
BUN: 10 mg/dL (ref 6–20)
CHLORIDE: 104 mmol/L (ref 98–111)
CO2: 27 mmol/L (ref 22–32)
Calcium: 9 mg/dL (ref 8.9–10.3)
Creatinine, Ser: 0.93 mg/dL (ref 0.61–1.24)
GFR calc Af Amer: >60 mL/min (ref >60)
GLUCOSE: 119 mg/dL — AB (ref 70–99)
POTASSIUM: 3.6 mmol/L (ref 3.5–5.1)
Sodium: 138 mmol/L (ref 135–145)

## 2018-02-04 LAB — HEPATIC FUNCTION PANEL
ALBUMIN: 4 g/dL (ref 3.5–5.0)
ALK PHOS: 119 U/L (ref 38–126)
ALT: 72 U/L — AB (ref 0–44)
AST: 77 U/L — ABNORMAL HIGH (ref 15–41)
BILIRUBIN INDIRECT: 1.3 mg/dL — AB (ref 0.3–0.9)
Bilirubin, Direct: 0.3 mg/dL — ABNORMAL HIGH (ref 0.0–0.2)
TOTAL PROTEIN: 7.5 g/dL (ref 6.5–8.1)
Total Bilirubin: 1.6 mg/dL — ABNORMAL HIGH (ref 0.3–1.2)

## 2018-02-04 LAB — CBC WITH DIFFERENTIAL/PLATELET
BASOS ABS: 0.1 10*3/uL (ref 0.0–0.1)
BASOS PCT: 1 %
EOS ABS: 0 10*3/uL (ref 0.0–0.7)
EOS PCT: 1 %
HCT: 37.7 % — ABNORMAL LOW (ref 39.0–52.0)
HEMOGLOBIN: 12.6 g/dL — AB (ref 13.0–17.0)
Lymphocytes Relative: 46 %
Lymphs Abs: 3 10*3/uL (ref 0.7–4.0)
MCH: 30.6 pg (ref 26.0–34.0)
MCHC: 33.4 g/dL (ref 30.0–36.0)
MCV: 91.5 fL (ref 78.0–100.0)
Monocytes Absolute: 0.6 10*3/uL (ref 0.1–1.0)
Monocytes Relative: 9 %
NEUTROS PCT: 43 %
Neutro Abs: 2.8 10*3/uL (ref 1.7–7.7)
PLATELETS: 123 10*3/uL — AB (ref 150–400)
RBC: 4.12 MIL/uL — AB (ref 4.22–5.81)
RDW: 14.1 % (ref 11.5–15.5)
WBC: 6.5 10*3/uL (ref 4.0–10.5)

## 2018-02-04 MED ORDER — IOPAMIDOL (ISOVUE-300) INJECTION 61%
100.0000 mL | Freq: Once | INTRAVENOUS | Status: AC | PRN
  Administered 2018-02-04: 100 mL via INTRAVENOUS

## 2018-02-04 NOTE — Progress Notes (Signed)
   Subjective:    Patient ID: Chris Holidayonald Keith Seiter Jr., male    DOB: 24-Mar-1976, 42 y.o.   MRN: [id number]  HPI Patient is here today to follow up on appendix surgery a month ago. He states he has not felt right since. He has no appetite and has sweats. Feels bp is up.Has a sensation of phone vibrating in pocket,but when he looks it did not go off.  Had appendicitis one mo ago  Pain came on rather sudden  Ended up getting scan at [facility] showing apendicitis  Then the surgeon said aft surg it had ruptured   Had drain tube for one and a half week  Notes diminsed energy  Night sweats   And cold chils  Sweating energy level not good   Appetite so so  Energy level not as good   abd paoin none evident    Saw surg in follow up   Prior approval for CT abdomen and pelvis with contrast obtained via [employer])- [phone] Prior Aut #   [id number]  Number given to Radiology.    Review of Systems Positive diminished energy positive low abdominal discomfort diminished appetite chills    Objective:   Physical Exam  Alert and oriented, vitals reviewed and stable, NAD ENT-TM's and ext canals WNL bilat via otoscopic exam Soft palate, tonsils and post pharynx WNL via oropharyngeal exam Neck-symmetric, no masses; thyroid nonpalpable and nontender Pulmonary-no tachypnea or accessory muscle use; Clear without wheezes via auscultation Card--no abnrml murmurs, rhythm reg and rate WNL Carotid pulses symmetric, without bruits Lower abdomen scar noted diffuse mild tenderness no rebound no guarding bowel sounds good      Assessment & Plan:  Impression concerning history with ruptured appendix subsequent discharge from the hospital with drain antibiotics taken now with persistent chills sweats malaise anorexia and some persistent lower abdominal discomfort.  Stat blood work.  Addendum liver enzymes mildly elevated this can be a sign of intra-abdominal infection discussed with  patient CT scan ordered  Greater than 50% of this 25 minute face to face visit was spent in counseling and discussion and coordination of care regarding the above diagnosis/diagnosies

## 2018-02-04 NOTE — Telephone Encounter (Signed)
Dr Brett Canalessteve ordered stat.

## 2018-02-04 NOTE — Telephone Encounter (Signed)
Patient waiting at hospital. Just had CT scan done. Waiting to be called with the results.  Cell # [phone]

## 2018-02-04 NOTE — Telephone Encounter (Signed)
Dr Lorin Picketscott discussed with dr Brett Canalessteve. See result note. Pt was notified of results.

## 2018-02-05 ENCOUNTER — Ambulatory Visit: Payer: Managed Care, Other (non HMO) | Admitting: Family Medicine

## 2018-02-05 ENCOUNTER — Ambulatory Visit (HOSPITAL_COMMUNITY)
Admission: RE | Admit: 2018-02-05 | Discharge: 2018-02-05 | Disposition: A | Discharge status: Home / Self Care | Payer: Managed Care, Other (non HMO) | Source: Ambulatory Visit | Attending: Family Medicine | Admitting: Family Medicine

## 2018-02-05 ENCOUNTER — Encounter: Payer: Self-pay | Admitting: Family Medicine

## 2018-02-05 ENCOUNTER — Other Ambulatory Visit: Payer: Self-pay

## 2018-02-05 VITALS — BP 146/100 | Temp 98.7°F | Ht 69.0 in | Wt 224.0 lb

## 2018-02-05 DIAGNOSIS — R1011 Right upper quadrant pain: Secondary | ICD-10-CM

## 2018-02-05 DIAGNOSIS — K8020 Calculus of gallbladder without cholecystitis without obstruction: Secondary | ICD-10-CM | POA: Diagnosis not present

## 2018-02-05 DIAGNOSIS — K760 Fatty (change of) liver, not elsewhere classified: Secondary | ICD-10-CM | POA: Diagnosis not present

## 2018-02-05 DIAGNOSIS — K8060 Calculus of gallbladder and bile duct with cholecystitis, unspecified, without obstruction: Secondary | ICD-10-CM

## 2018-02-05 DIAGNOSIS — R509 Fever, unspecified: Secondary | ICD-10-CM

## 2018-02-05 DIAGNOSIS — R748 Abnormal levels of other serum enzymes: Secondary | ICD-10-CM

## 2018-02-05 DIAGNOSIS — K824 Cholesterolosis of gallbladder: Secondary | ICD-10-CM | POA: Insufficient documentation

## 2018-02-05 NOTE — Progress Notes (Signed)
   Subjective:    Patient ID: Chris Holidayonald Keith Guo Jr., male    DOB: [birth year], 42 y.o.   MRN: [id number] Please see yesterday's note.  Patient returns with spouse today numerous concerns. HPI  Patient is here today for follow up. He states he is still feeling bad. He woke up last night in sweats. He had ran a fever yesterday evening from 6pm to 12 am.     Had fever last night 100.8  haing bd sweats and chills  Not good energy  See prior note  Basically diminished energy and a sense of nighttime chills and sweats pretty much since discharge from the hospital.  See blood work from yesterday.  White blood count was normal platelets borderline low.  Liver enzymes mild elevation mild elevation of bili.  Review of records reveals no prior history of liver enzyme elevation.  CT scan abdomen and pelvis done due to history.  This returned negative for abscess.  However there was cholelithiasis and fatty liver.  Patient notes anorexia.  And low-grade fever.  No obvious nausea.  Family wonders if all this is coming from gallbladder     Review of Systems No headache, no major weight loss or weight gain, no chest pain no back pain abdominal pain no change in bowel habits complete ROS otherwise negative     Objective:   Physical Exam  Alert and oriented, vitals reviewed and stable, NAD ENT-TM's and ext canals WNL bilat via otoscopic exam Soft palate, tonsils and post pharynx WNL via oropharyngeal exam Neck-symmetric, no masses; thyroid nonpalpable and nontender Pulmonary-no tachypnea or accessory muscle use; Clear without wheezes via auscultation Card--no abnrml murmurs, rhythm reg and rate WNL Carotid pulses symmetric, without bruits Right upper quadrant mildly tender to deep palpation right lower quadrant mildly tender to palpation bowel sounds present no obvious jaundice      Assessment & Plan:  Impression #1 status post appendectomy with ruptured appendix and now residual  symptoms.  Yet normal white blood count normal scan with regards to right lower quadrant.  2.  Cholelithiasis.  No evidence of pericolic inflammation however will do ultrasound with persistent symptomatology rationale discussed her graph #3 fatty liver discussed ramifications discussed  4.  Borderline low platelet counts  Further recommendations based on ultrasound/I will also touch base with patient's surgeon to see if he has any additional  Greater than 50% of this 25 minute face to face visit was spent in counseling and discussion and coordination of care regarding the above diagnosis/diagnosies  Addendum ultrasound reveals no acute inflammation.  There are stones.  I spoke with patient's surgeon.  He feels no need for antibiotics right now.  He will be happy to see patient in follow-up we will request patient's thoughts in this regard.  Also follow-up CBC and liver enzymes early next week

## 2018-02-06 ENCOUNTER — Telehealth: Payer: Self-pay | Admitting: Family Medicine

## 2018-02-06 NOTE — Telephone Encounter (Signed)
Patient wants to know if its OK for him to start taking multivitamins?

## 2018-02-06 NOTE — Telephone Encounter (Signed)
Yes ok per Dr. Brett CanalesSteve. Pt notified.

## 2018-02-10 LAB — CBC WITH DIFFERENTIAL/PLATELET
BASOS ABS: 0.2 10*3/uL (ref 0.0–0.2)
Basos: 2 %
EOS (ABSOLUTE): 0.1 10*3/uL (ref 0.0–0.4)
Eos: 1 %
Hematocrit: 36.7 % — ABNORMAL LOW (ref 37.5–51.0)
Hemoglobin: 12.2 g/dL — ABNORMAL LOW (ref 13.0–17.7)
IMMATURE GRANULOCYTES: 0 %
Immature Grans (Abs): 0 10*3/uL (ref 0.0–0.1)
LYMPHS: 56 %
Lymphocytes Absolute: 4.4 10*3/uL — ABNORMAL HIGH (ref 0.7–3.1)
MCH: 29.9 pg (ref 26.6–33.0)
MCHC: 33.2 g/dL (ref 31.5–35.7)
MCV: 90 fL (ref 79–97)
MONOCYTES: 9 %
Monocytes Absolute: 0.7 10*3/uL (ref 0.1–0.9)
NEUTROS PCT: 32 %
Neutrophils Absolute: 2.5 10*3/uL (ref 1.4–7.0)
PLATELETS: 172 10*3/uL (ref 150–450)
RBC: 4.08 x10E6/uL — AB (ref 4.14–5.80)
RDW: 14.5 % (ref 12.3–15.4)
WBC: 7.8 10*3/uL (ref 3.4–10.8)

## 2018-02-10 LAB — HEPATIC FUNCTION PANEL
ALT: 72 IU/L — AB (ref 0–44)
AST: 62 IU/L — ABNORMAL HIGH (ref 0–40)
Albumin: 4.1 g/dL (ref 3.5–5.5)
Alkaline Phosphatase: 154 IU/L — ABNORMAL HIGH (ref 39–117)
BILIRUBIN TOTAL: 1 mg/dL (ref 0.0–1.2)
Bilirubin, Direct: 0.31 mg/dL (ref 0.00–0.40)
Total Protein: 6.9 g/dL (ref 6.0–8.5)

## 2018-02-11 ENCOUNTER — Encounter: Payer: Self-pay | Admitting: Family Medicine

## 2018-02-11 ENCOUNTER — Telehealth: Payer: Self-pay | Admitting: Surgery

## 2018-02-11 ENCOUNTER — Ambulatory Visit (INDEPENDENT_AMBULATORY_CARE_PROVIDER_SITE_OTHER): Payer: Managed Care, Other (non HMO) | Admitting: Family Medicine

## 2018-02-11 VITALS — BP 136/82 | Ht 69.0 in | Wt 228.4 lb

## 2018-02-11 DIAGNOSIS — R748 Abnormal levels of other serum enzymes: Secondary | ICD-10-CM | POA: Diagnosis not present

## 2018-02-11 DIAGNOSIS — K8060 Calculus of gallbladder and bile duct with cholecystitis, unspecified, without obstruction: Secondary | ICD-10-CM | POA: Diagnosis not present

## 2018-02-11 DIAGNOSIS — K824 Cholesterolosis of gallbladder: Secondary | ICD-10-CM | POA: Diagnosis not present

## 2018-02-11 DIAGNOSIS — R1011 Right upper quadrant pain: Secondary | ICD-10-CM | POA: Diagnosis not present

## 2018-02-11 NOTE — Progress Notes (Signed)
Subjective:    Patient ID: Betsey Holidayonald Keith Thul Jr., male    DOB: 1976-02-17, 42 y.o.   MRN: [id number] Patient arrives for protracted discussion concerning his status HPI Patient arrives to follow up on abdominal pain and recent lab work. Patient states he is feeling a little better this week.   See prior notes.  No more fever the last several days.  Energy somewhat improved.  Appetite rebound.  No major abdominal pain at this time.  I reviewed with patient face-to-face discussion from his surgeon.  Last week based on negative CT scan he recommended no antibiotics.  Follow-up with him gallstones.  Patient's blood work just done 2 days ago  Results for orders placed or performed in visit on 02/04/18  Hepatic function panel  Result Value Ref Range   Total Protein 6.9 6.0 - 8.5 g/dL   Albumin 4.1 3.5 - 5.5 g/dL   Bilirubin Total 1.0 0.0 - 1.2 mg/dL   Bilirubin, Direct 1.190.31 0.00 - 0.40 mg/dL   Alkaline Phosphatase 154 (H) 39 - 117 IU/L   AST 62 (H) 0 - 40 IU/L   ALT 72 (H) 0 - 44 IU/L  CBC with Differential/Platelet  Result Value Ref Range   WBC 7.8 3.4 - 10.8 x10E3/uL   RBC 4.08 (L) 4.14 - 5.80 x10E6/uL   Hemoglobin 12.2 (L) 13.0 - 17.7 g/dL   Hematocrit 14.736.7 (L) 82.937.5 - 51.0 %   MCV 90 79 - 97 fL   MCH 29.9 26.6 - 33.0 pg   MCHC 33.2 31.5 - 35.7 g/dL   RDW 56.214.5 13.012.3 - 86.515.4 %   Platelets 172 150 - 450 x10E3/uL   Neutrophils 32 Not Estab. %   Lymphs 56 Not Estab. %   Monocytes 9 Not Estab. %   Eos 1 Not Estab. %   Basos 2 Not Estab. %   Neutrophils Absolute 2.5 1.4 - 7.0 x10E3/uL   Lymphocytes Absolute 4.4 (H) 0.7 - 3.1 x10E3/uL   Monocytes Absolute 0.7 0.1 - 0.9 x10E3/uL   EOS (ABSOLUTE) 0.1 0.0 - 0.4 x10E3/uL   Basophils Absolute 0.2 0.0 - 0.2 x10E3/uL   Immature Granulocytes 0 Not Estab. %   Immature Grans (Abs) 0.0 0.0 - 0.1 x10E3/uL   Hematology Comments: Note:    Fortunately white blood count normal platelets back up to normal.  Liver enzymes slightly  improved.  Reiterated with patient ultrasound revealed both gallstones and gallbladder polyp and fatty liver      Review of Systems No headache, no major weight loss or weight gain, no chest pain no back pain abdominal pain no change in bowel habits complete ROS otherwise negative     Objective:   Physical Exam  Alert and oriented, vitals reviewed and stable, NAD ENT-TM's and ext canals WNL bilat via otoscopic exam Soft palate, tonsils and post pharynx WNL via oropharyngeal exam Neck-symmetric, no masses; thyroid nonpalpable and nontender Pulmonary-no tachypnea or accessory muscle use; Clear without wheezes via auscultation Card--no abnrml murmurs, rhythm reg and rate WNL Carotid pulses symmetric, without bruits       Assessment & Plan:  Impression elevated liver enzymes accompanied by low platelets, both of these are improved on most recent blood work.  Symptomatically patient is also improving.  Were left with the challenge of gallstones along with a gallbladder polyp and the recent symptomatology change elevation.  Up-to-date search done in presence of patient.  Experts there recommend when gallstones are accompanied by gallbladder polyp, with potential for malignant  transformation of the gallbladder polyp, I recommend excision.  Discussed with patient.  Patient to follow-up with surgeon.  Recommend to get some blood work 6 weeks after cholecystectomy if he ends up getting  Greater than 50% of this 25 minute face to face visit was spent in counseling and discussion and coordination of care regarding the above diagnosis/diagnosies

## 2018-02-11 NOTE — Telephone Encounter (Signed)
Call to schedule patient

## 2018-02-13 ENCOUNTER — Telehealth: Payer: Self-pay | Admitting: Family Medicine

## 2018-02-13 NOTE — Telephone Encounter (Signed)
Patient is aware and the referral was already placed and pt is aware to let us know when the surgeon calls him with the appt date and time.

## 2018-02-13 NOTE — Telephone Encounter (Signed)
Pt's wife called, pt wanted to know if we were contacting the surgeon or if the pt is supposed to contact the surgeon  States someone mentioned pt may need MRI (not sure if we mentioned this or if the surgeon did)  Also we do not have a DPR on file so we will have to speak to the patient  [phone] - requesting a call back from [place]   (note in referral to surgeon states that they called pt & pt was to check his work schedule & call them back - not further info found)

## 2018-02-13 NOTE — Telephone Encounter (Signed)
I called and spoke to pt and he states he has tried to call Dr.Pabon and he has left messages for them to call him to have a consultation for gallbladder removal and they have not returned his call. I called Dr.Pabon's office today at [phone] left a message for them to call the pt or our office to set up a consultation for gallbladder removal as the pt has had surgery preformed by him in the past. Pt still states he does not feel right. He still having sweats. He states a friend of his had the same symptoms and with elevated liver enzymes sweats etc and had u/s and ct scan and nothing was found until after an MRI was done and they found a stone was lodged down in his liver or duct. Patient wants to know what you think of the need for a MRI. Please advise.

## 2018-02-13 NOTE — Telephone Encounter (Signed)
Up to surgeon, needs surg referral, surg may just want to go and remove with current criteria of stone plus polyp without putting pt thru an mri

## 2018-02-25 ENCOUNTER — Ambulatory Visit: Payer: Managed Care, Other (non HMO) | Admitting: Surgery

## 2018-03-23 ENCOUNTER — Ambulatory Visit: Payer: Managed Care, Other (non HMO) | Admitting: Surgery

## 2018-04-15 ENCOUNTER — Ambulatory Visit: Payer: Managed Care, Other (non HMO) | Admitting: Surgery

## 2018-04-22 ENCOUNTER — Ambulatory Visit: Payer: Managed Care, Other (non HMO) | Admitting: Surgery

## 2018-09-10 ENCOUNTER — Telehealth: Payer: Self-pay | Admitting: Family Medicine

## 2018-09-10 NOTE — Telephone Encounter (Signed)
Last seen 8.20.19

## 2018-09-10 NOTE — Telephone Encounter (Signed)
Requesting refill for fluticasone [facility]) 50 MCG/ACT nasal spray   [facility] [address], [place], [place] [address]

## 2018-09-10 NOTE — Telephone Encounter (Signed)
Ok six ref 

## 2018-09-11 ENCOUNTER — Other Ambulatory Visit: Payer: Self-pay | Admitting: *Deleted

## 2018-09-11 MED ORDER — FLUTICASONE PROPIONATE 50 MCG/ACT NA SUSP: 2.0000 | Freq: Every day | NASAL | 5 refills | Status: AC

## 2018-09-11 NOTE — Telephone Encounter (Signed)
Med sent to pharm. Pt notified.  

## 2018-11-02 ENCOUNTER — Other Ambulatory Visit: Payer: Self-pay

## 2018-11-02 ENCOUNTER — Encounter: Payer: Self-pay | Admitting: Family Medicine

## 2018-11-02 ENCOUNTER — Ambulatory Visit (INDEPENDENT_AMBULATORY_CARE_PROVIDER_SITE_OTHER): Payer: Managed Care, Other (non HMO) | Admitting: Family Medicine

## 2018-11-02 DIAGNOSIS — R071 Chest pain on breathing: Secondary | ICD-10-CM

## 2018-11-02 MED ORDER — TIZANIDINE HCL 4 MG PO TABS: 4.0000 mg | ORAL_TABLET | Freq: Three times a day (TID) | ORAL | 0 refills | Status: DC | PRN

## 2018-11-02 MED ORDER — ETODOLAC 400 MG PO TABS: 400.0000 mg | ORAL_TABLET | Freq: Two times a day (BID) | ORAL | 0 refills | Status: DC | PRN

## 2018-11-02 NOTE — Progress Notes (Signed)
   Subjective:    Patient ID: Chris Griffith., male    DOB: [birth year], 43 y.o.   MRN: [id number]  HPI Patient calls with muscle spasms in his rib area- comes from the back to the front on his left side. Patient states it got worse over the weekend. Patient states this happened to him once before and had to go to ER and they told him his potassium was low. Patient has been doing excess physical activity recently.  A lot of extra muscle strain.  Also working in the heat. Virtual Visit via Video Note  I connected with Chris Griffith. on 11/02/18 at 11:00 AM EDT by a video enabled telemedicine application and verified that I am speaking with the correct person using two identifiers.  Location: Patient: home Provider: office   I discussed the limitations of evaluation and management by telemedicine and the availability of in person appointments. The patient expressed understanding and agreed to proceed.  History of Present Illness:    Observations/Objective:   Assessment and Plan:   Follow Up Instructions:    I discussed the assessment and treatment plan with the patient. The patient was provided an opportunity to ask questions and all were answered. The patient agreed with the plan and demonstrated an understanding of the instructions.   The patient was advised to call back or seek an in-person evaluation if the symptoms worsen or if the condition fails to improve as anticipated.  I provided 15 minutes of non-face-to-face time during this encounter. Pain sharp in nature.  Worse with a deep breath.  Kathleen Lime, RN    Review of Systems No headache, no major weight loss or weight gain, no chest pain no back pain abdominal pain no change in bowel habits complete ROS otherwise negative     Objective:   Physical Exam  Virtual visit      Assessment & Plan:  Impression chest wall inflammation with muscle spasm.  Anti-inflammatory medicine prescribed muscle  spasm medicine prescribed.  Symptom care discussed.  Warning signs discussed.  Liberalize salt intake with the eat

## 2019-08-02 ENCOUNTER — Encounter: Payer: Self-pay | Admitting: Family Medicine

## 2019-10-22 ENCOUNTER — Ambulatory Visit: Payer: Managed Care, Other (non HMO) | Admitting: Family Medicine

## 2019-10-22 ENCOUNTER — Other Ambulatory Visit: Payer: Self-pay

## 2019-10-22 ENCOUNTER — Encounter: Payer: Self-pay | Admitting: Family Medicine

## 2019-10-22 VITALS — BP 162/88 | HR 93 | Temp 98.4°F | Wt 243.0 lb

## 2019-10-22 DIAGNOSIS — L03032 Cellulitis of left toe: Secondary | ICD-10-CM

## 2019-10-22 DIAGNOSIS — M79675 Pain in left toe(s): Secondary | ICD-10-CM | POA: Diagnosis not present

## 2019-10-22 DIAGNOSIS — K8020 Calculus of gallbladder without cholecystitis without obstruction: Secondary | ICD-10-CM | POA: Diagnosis not present

## 2019-10-22 DIAGNOSIS — K824 Cholesterolosis of gallbladder: Secondary | ICD-10-CM | POA: Diagnosis not present

## 2019-10-22 DIAGNOSIS — K760 Fatty (change of) liver, not elsewhere classified: Secondary | ICD-10-CM | POA: Insufficient documentation

## 2019-10-22 DIAGNOSIS — I10 Essential (primary) hypertension: Secondary | ICD-10-CM

## 2019-10-22 MED ORDER — CEPHALEXIN 500 MG PO CAPS: 500.0000 mg | ORAL_CAPSULE | Freq: Two times a day (BID) | ORAL | 0 refills | Status: DC

## 2019-10-22 NOTE — Progress Notes (Signed)
Patient ID: Chris Griffith., male    DOB: 1976-03-01, 44 y.o.   MRN: [id number]   Chief Complaint  Patient presents with  . Toe Pain   Subjective:   HPI Pt having left 2nd toe pain.  Started about 1 wk ago and woke up in the night with the pain.  Feeling like he "stubbed his toe."  But doesn't remember any injury or trauma.  Then yesterday wore some boots to work and then having worsening pain today and redness.  No h/o gout. Thought maybe an insect bite to space btw 2nd/3rd toe on left. Redness just on distal end of toe on left 2nd. Took ibuprofen for it, has given mild relief.   Also reported in 2019- was having issues with elevated liver enzymes and h/o gallbladder stones and a polyp.  Was told he needed to have the gallbladder removed, but hasn't gone yet due to the pandemic. Not having pain in right upper quadrant for sweats or fever.    Medical History Chris Griffith has a past medical history of Hypertension.   Outpatient Encounter Medications as of 10/22/2019  Medication Sig  . cephALEXin (KEFLEX) 500 MG capsule Take 1 capsule (500 mg total) by mouth 2 (two) times daily.  . fluticasone (FLONASE) 50 MCG/ACT nasal spray Place 2 sprays into both nostrils daily.  Marland Kitchen ibuprofen (ADVIL,MOTRIN) 200 MG tablet Take 200 mg by mouth every 6 (six) hours as needed.  . [DISCONTINUED] acetaminophen (TYLENOL) 500 MG tablet Take 500 mg by mouth every 6 (six) hours as needed.  . [DISCONTINUED] etodolac (LODINE) 400 MG tablet Take 1 tablet (400 mg total) by mouth 2 (two) times daily as needed. With food  . [DISCONTINUED] HYDROcodone-acetaminophen (NORCO/VICODIN) 5-325 MG tablet Take 1-2 tablets by mouth every 4 (four) hours as needed for moderate pain.  . [DISCONTINUED] hydrocortisone (ANUSOL-HC) 2.5 % rectal cream Place 1 application rectally 2 (two) times daily. Apply bid to affected area  . [DISCONTINUED] tiZANidine (ZANAFLEX) 4 MG tablet Take 1 tablet (4 mg total) by mouth 3 (three) times  daily as needed for muscle spasms.   No facility-administered encounter medications on file as of 10/22/2019.     Review of Systems  Constitutional: Negative for chills and fever.  Cardiovascular: Negative for chest pain and leg swelling.  Gastrointestinal: Negative for abdominal pain, nausea and vomiting.  Musculoskeletal:       +Left 2nd toe pain.  Skin: Positive for rash.     Vitals BP (!) 162/88   Pulse 93   Temp 98.4 F (36.9 C)   Wt 243 lb (110.2 kg)   SpO2 97%   BMI 35.88 kg/m   Objective:   Physical Exam Vitals reviewed.  Constitutional:      General: He is not in acute distress.    Appearance: Normal appearance.  Musculoskeletal:        General: Tenderness (left 2nd toe) present. No deformity or signs of injury.  Skin:    General: Skin is warm and dry.     Findings: Erythema and rash (mild erythema on distal 2nd left toe, with mild swelling.  dec rom with flexion of 2nd left toe ) present.     Comments: Small puncture lesion on web space btw 2nd and 3rd toes on left  Neurological:     Mental Status: He is alert.      Assessment and Plan   1. Cellulitis of toe of left foot - cephALEXin (KEFLEX) 500 MG capsule; Take 1  capsule (500 mg total) by mouth 2 (two) times daily.  Dispense: 14 capsule; Refill: 0  2. Toe pain, left - DG Toe 2nd Left; Future - CBC - Uric acid - Sed Rate (ESR) - C-reactive protein - CMP14+EGFR  3. Gallstones  4. Gallbladder polyp  5. Hepatic steatosis - CMP14+EGFR  6. Hypertension, uncontrolled   Left 2nd toe with possible cellulitis- gave 7 days keflex. Ordered labs for evaluation of gout.  Elevated blood pressure, pt in pain today.  will recheck on next visit. Not currently on medications for this, has tried meds in past but gave him some side effects.  Gallbladder stones, gallbladder polyp, and hepatic steatosis- stable, reviewed images and labs.  will order hepatic enzymes. Cont to monitor.  F/u with surgeon for  surgery.  Pt to call or return if worsening pain, fever, or increased redness.  Pt in agreement.  Patient discussed with provider, patient seen and evaluated.  Issues discussed.  Agree with management/Dr. Mickie Hillier MD

## 2019-10-23 LAB — CMP14+EGFR
ALT: 29 IU/L (ref 0–44)
AST: 20 IU/L (ref 0–40)
Albumin/Globulin Ratio: 2 (ref 1.2–2.2)
Albumin: 4.8 g/dL (ref 4.0–5.0)
Alkaline Phosphatase: 73 IU/L (ref 39–117)
BUN/Creatinine Ratio: 15 (ref 9–20)
BUN: 15 mg/dL (ref 6–24)
Bilirubin Total: 0.7 mg/dL (ref 0.0–1.2)
CO2: 27 mmol/L (ref 20–29)
Calcium: 9.9 mg/dL (ref 8.7–10.2)
Chloride: 100 mmol/L (ref 96–106)
Creatinine, Ser: 1.01 mg/dL (ref 0.76–1.27)
GFR calc Af Amer: 103 mL/min/{1.73_m2} (ref >59)
GFR calc non Af Amer: 89 mL/min/{1.73_m2} (ref >59)
Globulin, Total: 2.4 g/dL (ref 1.5–4.5)
Glucose: 130 mg/dL — ABNORMAL HIGH (ref 65–99)
Potassium: 3.9 mmol/L (ref 3.5–5.2)
Sodium: 140 mmol/L (ref 134–144)
Total Protein: 7.2 g/dL (ref 6.0–8.5)

## 2019-10-23 LAB — CBC
Hematocrit: 42.8 % (ref 37.5–51.0)
Hemoglobin: 14.9 g/dL (ref 13.0–17.7)
MCH: 31.2 pg (ref 26.6–33.0)
MCHC: 34.8 g/dL (ref 31.5–35.7)
MCV: 90 fL (ref 79–97)
Platelets: 251 10*3/uL (ref 150–450)
RBC: 4.77 x10E6/uL (ref 4.14–5.80)
RDW: 12.6 % (ref 11.6–15.4)
WBC: 7.1 10*3/uL (ref 3.4–10.8)

## 2019-10-23 LAB — URIC ACID: Uric Acid: 6.8 mg/dL (ref 3.8–8.4)

## 2019-10-23 LAB — SEDIMENTATION RATE: Sed Rate: 9 mm/hr (ref 0–15)

## 2019-10-23 LAB — C-REACTIVE PROTEIN: CRP: 4 mg/L (ref 0–10)

## 2020-06-10 IMAGING — CT CT ABD-PELV W/ CM
2 of 5 series · 16 of 46 positions shown, 18 images · IV contrast (Isovue)
Comparison: January 06, 2018

CLINICAL DATA: Patient had appendectomy in early [REDACTED] and complains
of decreased appetite, weight loss and night sweats for 3 weeks.

EXAM:
CT ABDOMEN AND PELVIS WITH CONTRAST
TECHNIQUE: Multidetector CT imaging of the abdomen and pelvis was performed
using the standard protocol following bolus administration of
intravenous contrast.
CONTRAST:  100mL [id number] IOPAMIDOL ([id number]) INJECTION 61%

[Series 2: axial st · axial · 0.83mm/px · z∈[+845,+1300]mm · 13 of 107 slices shown, 15 images]
[im 8/107  soft-tissue]
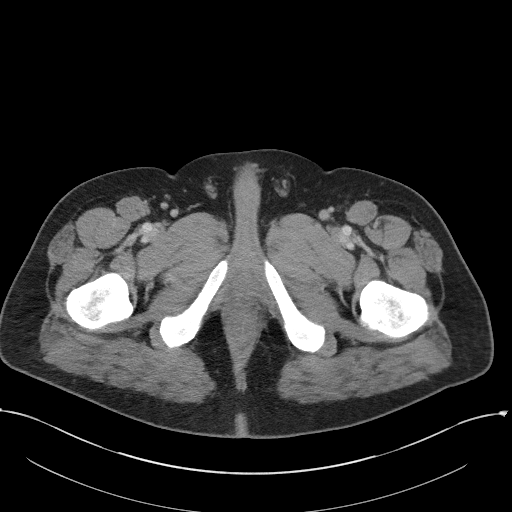
[im 8/107  bone]
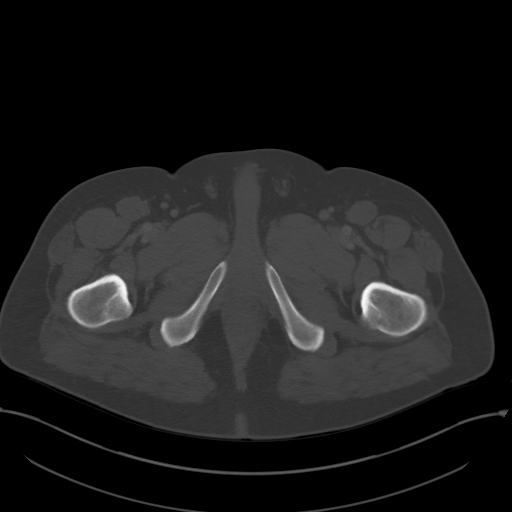
[im 16/107  soft-tissue]
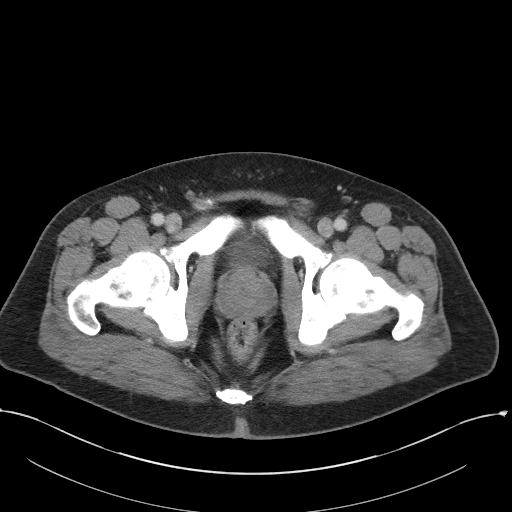
[im 23/107  soft-tissue]
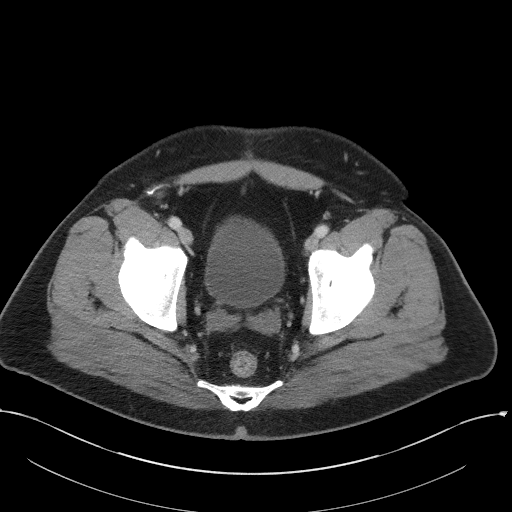
[im 31/107  soft-tissue]
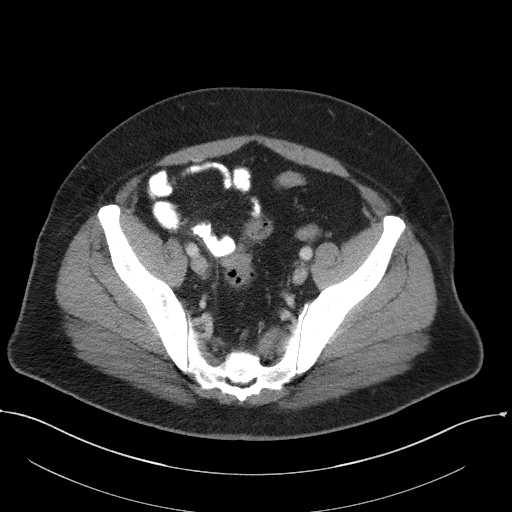
[im 38/107  soft-tissue]
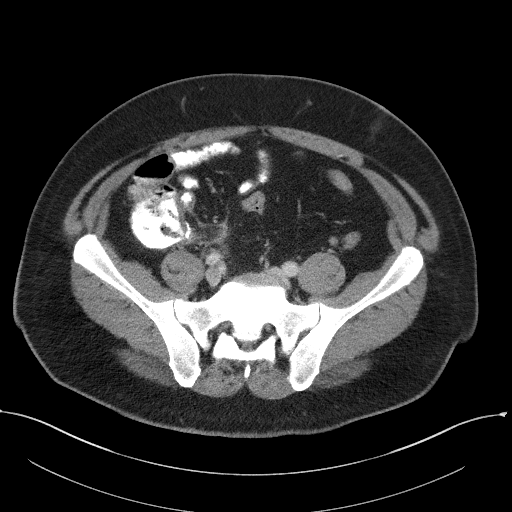
[im 46/107  soft-tissue]
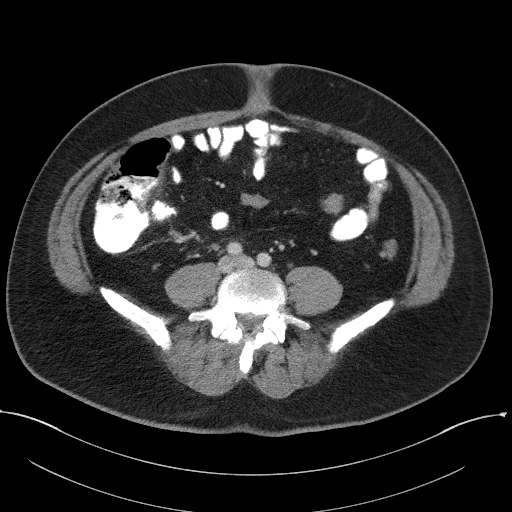
[im 54/107  soft-tissue]
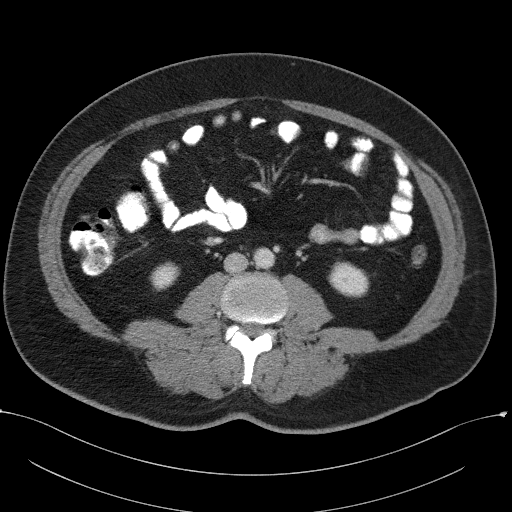
[im 61/107  soft-tissue]
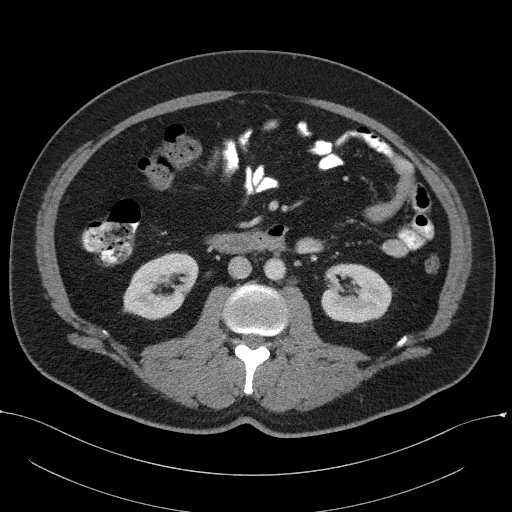
[im 69/107  soft-tissue]
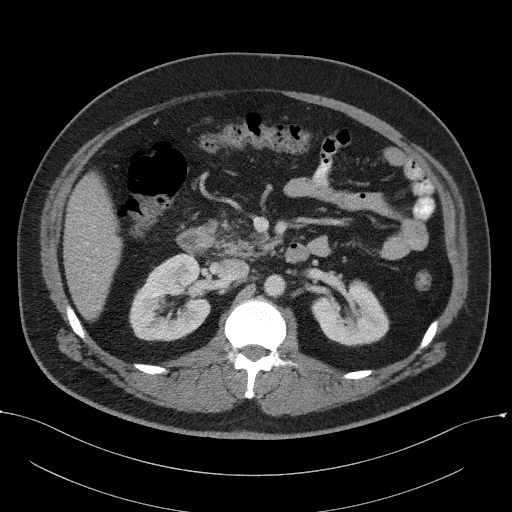
[im 69/107  bone]
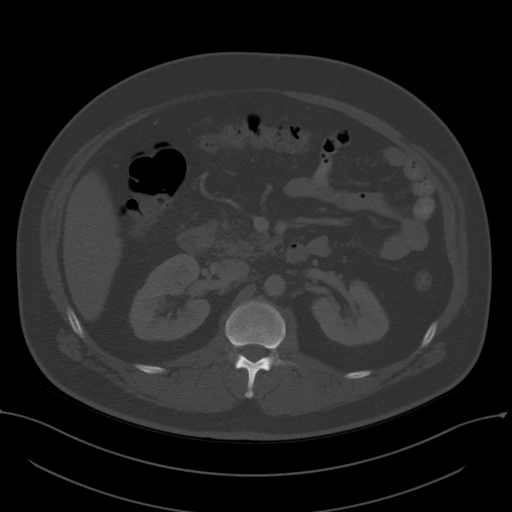
[im 76/107  soft-tissue]
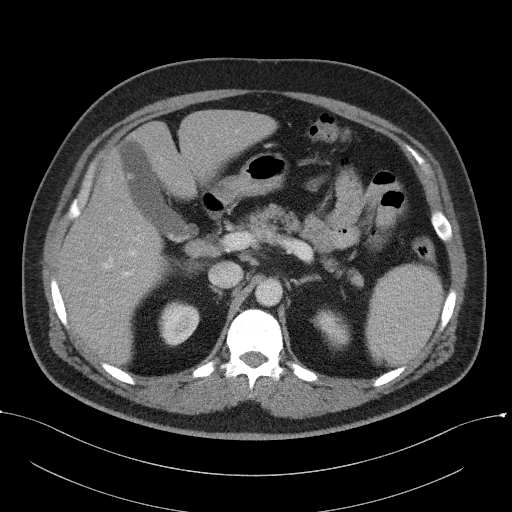
[im 84/107  soft-tissue]
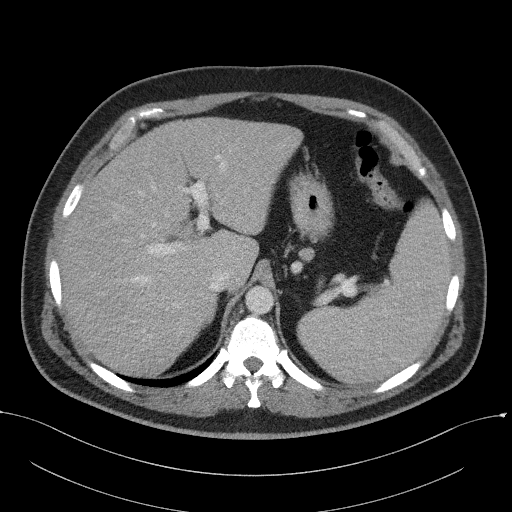
[im 91/107  soft-tissue]
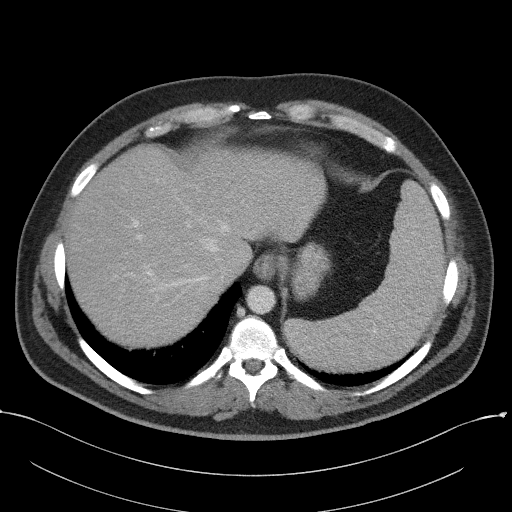
[im 99/107  soft-tissue]
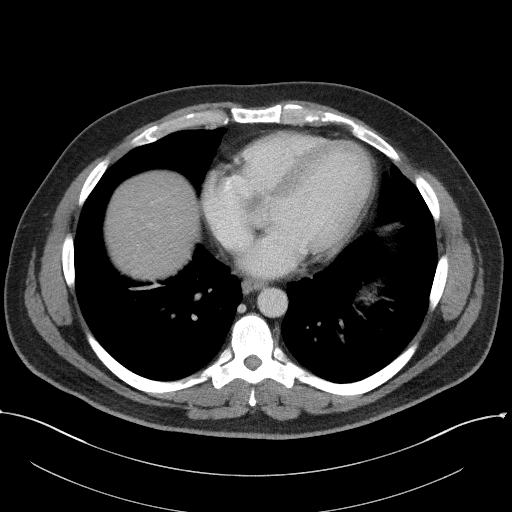

[Series 4: coronal st · coronal · 0.81mm/px · 3 of 107 slices shown]
[im 36/107  soft-tissue]
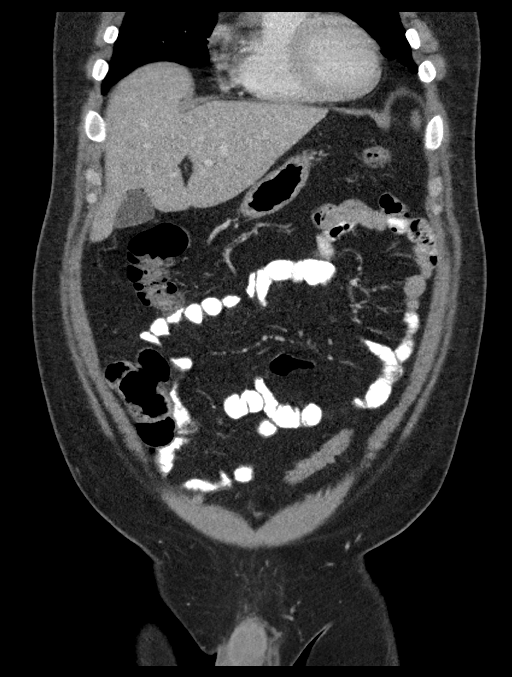
[im 48/107  soft-tissue]
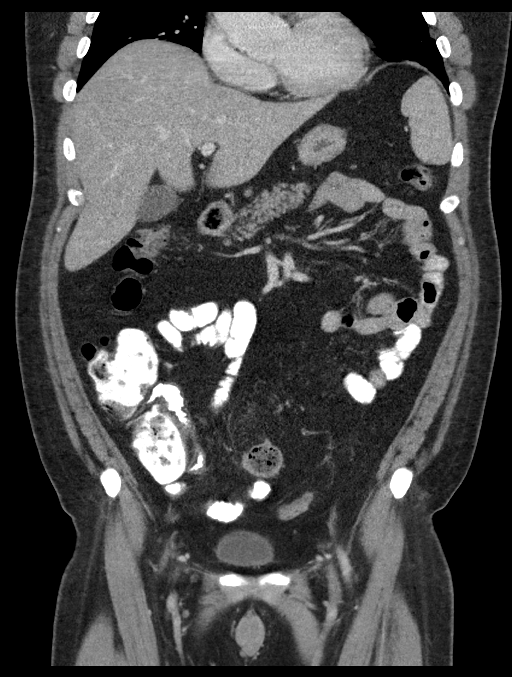
[im 59/107  soft-tissue]
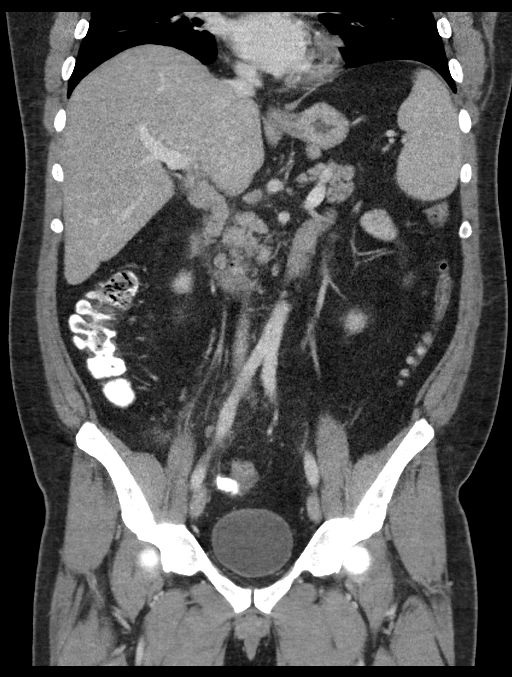

[16 of 46 positions shown; findings below may reference images not displayed]

FINDINGS: Lower chest: There is mild atelectasis of the left lung base. The
heart size is normal.

Hepatobiliary: There is diffuse low density of the liver. No focal
liver lesion is identified. Gallstones are noted in the gallbladder.
The biliary tree is normal.

Pancreas: Unremarkable. No pancreatic ductal dilatation or
surrounding inflammatory changes.

Spleen: Normal in size without focal abnormality.

Adrenals/Urinary Tract: Adrenal glands are unremarkable. Kidneys are
normal, without renal calculi, focal lesion, or hydronephrosis.
Bladder is unremarkable.

Stomach/Bowel: There is stranding in the mesenteric fat of the right
lower quadrant consistent with recent postoperative change. No focal
discrete abscess is identified. There is no bowel obstruction. There
is diverticulosis of colon without diverticulitis. There is minimal
hiatal hernia. The stomach is otherwise normal.

Vascular/Lymphatic: No significant vascular findings are present. No
enlarged abdominal or pelvic lymph nodes.

Reproductive: Prostate is unremarkable.

Other: No abdominal wall hernia or abnormality. No abdominopelvic
ascites.

Musculoskeletal: Mild degenerative joint changes of the spine are
noted.
IMPRESSION: Postoperative changes in the right lower quadrant with mesenteric
fat stranding. No focal discrete abscess is identified. There is no
bowel obstruction.

Fatty infiltration of liver.

Cholelithiasis.

## 2020-06-27 ENCOUNTER — Telehealth: Payer: Managed Care, Other (non HMO) | Admitting: Family Medicine

## 2020-06-27 ENCOUNTER — Other Ambulatory Visit: Payer: Managed Care, Other (non HMO)

## 2021-06-11 ENCOUNTER — Ambulatory Visit (INDEPENDENT_AMBULATORY_CARE_PROVIDER_SITE_OTHER): Payer: BC Managed Care – PPO | Admitting: Surgery

## 2021-06-11 ENCOUNTER — Other Ambulatory Visit: Payer: Self-pay

## 2021-06-11 ENCOUNTER — Encounter: Payer: Self-pay | Admitting: Surgery

## 2021-06-11 VITALS — BP 147/100 | HR 86 | Temp 98.5°F | Ht 69.0 in | Wt 242.8 lb

## 2021-06-11 DIAGNOSIS — K8020 Calculus of gallbladder without cholecystitis without obstruction: Secondary | ICD-10-CM

## 2021-06-11 NOTE — Patient Instructions (Addendum)
Our surgery scheduler Britta Mccreedy will call you within 24-48 hours to get you scheduled. If you have not heard from her after 48 hours, please call our office. You will not need to get Covid tested before surgery and have the blue sheet available when she calls to write down important information.   Your Ultrasound is scheduled for 06/13/2021 at 10:30 am (arrive at 10:15 am) @ [facility]. Nothing to eat or drink after midnight.   Please have your labs done at [facility] at your earliest convenience.   If you have any concerns or questions, please feel free to call our office.  Cholelithiasis Cholelithiasis happens when gallstones form in the gallbladder. The gallbladder stores bile. Bile is a fluid that helps digest fats. Bile can harden and form into gallstones. If they cause a blockage, they can cause pain (gallbladder attack). What are the causes? This condition may be caused by: Some blood diseases, such as sickle cell anemia. Too much of a fat-like substance (cholesterol) in your bile. Not enough bile salts in your bile. These salts help the body absorb and digest fats. The gallbladder not emptying fully or often enough. This is common in pregnant women. What increases the risk? The following factors may make you more likely to develop this condition: Being male. Being pregnant many times. Eating a lot of fried foods, fat, and refined carbs (refined carbohydrates). Being very overweight (obese). Being older than age 54. Using medicines with male hormones in them for a long time. Losing weight fast. Having gallstones in your family. Having some health problems, such as diabetes, Crohn's disease, or liver disease. What are the signs or symptoms? Often, there may be gallstones but no symptoms. These gallstones are called silent gallstones. If a gallstone causes a blockage, you may get sudden pain. The pain: Can be in the upper right part of your belly (abdomen). Normally comes at night or after  you eat. Can last an hour or more. Can spread to your right shoulder, back, or chest. Can feel like discomfort, burning, or fullness in the upper part of your belly (indigestion). If the blockage lasts more than a few hours, you can get an infection or swelling. You may: Feel like you may vomit. Vomit. Feel bloated. Have belly pain for 5 hours or more. Feel tender in your belly, often in the upper right part and under your ribs. Have fever or chills. Have skin or the white parts of your eyes turn yellow (jaundice). Have dark pee (urine) or pale poop (stool). How is this treated? Treatment for this condition depends on how bad you feel. If you have symptoms, you may need: Home care, if symptoms are not very bad. Do not eat for 12-24 hours. Drink only water and clear liquids. Start to eat simple or clear foods after 1 or 2 days. Try broths and crackers. You may need medicines for pain or stomach upset or both. If you have an infection, you will need antibiotics. A hospital stay, if you have very bad pain or a very bad infection. Surgery to remove your gallbladder. You may need this if: Gallstones keep coming back. You have very bad symptoms. Medicines to break up gallstones. Medicines: Are best for small gallstones. May be used for up to 6-12 months. A procedure to find and take out gallstones or to break up gallstones. Follow these instructions at home: Medicines Take over-the-counter and prescription medicines only as told by your doctor. If you were prescribed an antibiotic medicine, take  it as told by your doctor. Do not stop taking the antibiotic even if you start to feel better. Ask your doctor if the medicine prescribed to you requires you to avoid driving or using machinery. Eating and drinking Drink enough fluid to keep your urine pale yellow. Drink water or clear fluids. This is important when you have pain. Eat healthy foods. Choose: Fewer fatty foods, such as fried  foods. Fewer refined carbs. Avoid breads and grains that are highly processed, such as white bread and white rice. Choose whole grains, such as whole-wheat bread and brown rice. More fiber. Almonds, fresh fruit, and beans are healthy sources. General instructions Keep a healthy weight. Keep all follow-up visits as told by your doctor. This is important. Where to find more information [employer] of Diabetes and Digestive and Kidney Diseases: [email or web address] Contact a doctor if: You have sudden pain in the upper right part of your belly. Pain might spread to your right shoulder, back, or chest. You have been diagnosed with gallstones that have no symptoms and you get: Belly pain. Discomfort, burning, or fullness in the upper part of your abdomen. You have dark urine or pale stools. Get help right away if: You have sudden pain in the upper right part of your abdomen, and the pain lasts more than 2 hours. You have pain in your abdomen, and: It lasts more than 5 hours. It keeps getting worse. You have a fever or chills. You keep feeling like you may vomit. You keep vomiting. Your skin or the white parts of your eyes turn yellow. Summary Cholelithiasis happens when gallstones form in the gallbladder. This condition may be caused by a blood disease, too much of a fat-like substance in the bile, or not enough bile salts in bile. Treatment for this condition depends on how bad you feel. If you have symptoms, do not eat or drink. You may need medicines. You may need a hospital stay for very bad pain or a very bad infection. You may need surgery if gallstones keep coming back or if you have very bad symptoms. This information is not intended to replace advice given to you by your health care provider. Make sure you discuss any questions you have with your health care provider. Document Revised: 08/06/2019 Document Reviewed: 05/10/2019 Elsevier Patient Education  2022 [employer].

## 2021-06-12 ENCOUNTER — Telehealth: Payer: Self-pay | Admitting: Surgery

## 2021-06-12 NOTE — Telephone Encounter (Signed)
Outgoing call is made, left message for patient to call.  Please inform of the following:  Pre-Admission date/time, COVID Testing date and Surgery date.  Surgery Date: 07/05/21 Preadmission Testing Date: 06/27/21 (phone 8a-1p) Covid Testing Date: Not needed.  Also patient will need to call at [phone], between 1-3:00pm the day before surgery, to find out what time to arrive for surgery.

## 2021-06-12 NOTE — Telephone Encounter (Signed)
Patient calls back, he is now aware of rescheduled surgery and verbalized understanding.

## 2021-06-13 ENCOUNTER — Other Ambulatory Visit: Payer: Self-pay

## 2021-06-13 ENCOUNTER — Ambulatory Visit
Admission: RE | Admit: 2021-06-13 | Discharge: 2021-06-13 | Disposition: A | Discharge status: Home / Self Care | Payer: BC Managed Care – PPO | Source: Ambulatory Visit | Attending: Surgery | Admitting: Surgery

## 2021-06-13 ENCOUNTER — Other Ambulatory Visit
Admission: RE | Admit: 2021-06-13 | Discharge: 2021-06-13 | Disposition: A | Discharge status: Home / Self Care | Payer: BC Managed Care – PPO | Source: Home / Self Care | Attending: Surgery | Admitting: Surgery

## 2021-06-13 DIAGNOSIS — K8020 Calculus of gallbladder without cholecystitis without obstruction: Secondary | ICD-10-CM | POA: Diagnosis not present

## 2021-06-13 LAB — COMPREHENSIVE METABOLIC PANEL
ALT: 24 U/L (ref 0–44)
AST: 19 U/L (ref 15–41)
Albumin: 4.3 g/dL (ref 3.5–5.0)
Alkaline Phosphatase: 55 U/L (ref 38–126)
Anion gap: 6 (ref 5–15)
BUN: 11 mg/dL (ref 6–20)
CO2: 29 mmol/L (ref 22–32)
Calcium: 9.2 mg/dL (ref 8.9–10.3)
Chloride: 104 mmol/L (ref 98–111)
Creatinine, Ser: 0.87 mg/dL (ref 0.61–1.24)
GFR, Estimated: >60 mL/min (ref >60)
Glucose, Bld: 91 mg/dL (ref 70–99)
Potassium: 3.9 mmol/L (ref 3.5–5.1)
Sodium: 139 mmol/L (ref 135–145)
Total Bilirubin: 1.2 mg/dL (ref 0.3–1.2)
Total Protein: 7.5 g/dL (ref 6.5–8.1)

## 2021-06-13 LAB — CBC
HCT: 42.9 % (ref 39.0–52.0)
Hemoglobin: 15.1 g/dL (ref 13.0–17.0)
MCH: 30.8 pg (ref 26.0–34.0)
MCHC: 35.2 g/dL (ref 30.0–36.0)
MCV: 87.6 fL (ref 80.0–100.0)
Platelets: 256 10*3/uL (ref 150–400)
RBC: 4.9 MIL/uL (ref 4.22–5.81)
RDW: 12.4 % (ref 11.5–15.5)
WBC: 8 10*3/uL (ref 4.0–10.5)
nRBC: 0 % (ref 0.0–0.2)

## 2021-06-14 ENCOUNTER — Inpatient Hospital Stay: Admission: RE | Admit: 2021-06-14 | Payer: BC Managed Care – PPO | Source: Ambulatory Visit

## 2021-06-14 NOTE — Progress Notes (Signed)
Patient ID: Chris Griffith., male   DOB: 08-24-1975, 45 y.o.   MRN: [id number]  HPI Chris Griffith. is a 45 y.o. male seen at the request of Dr. Ladona Ridgel for symptomatic cholelithiasis.  He reports that he has had a history of right upper quadrant pain that has been intermittent for about 3 years.  Over the last couple months symptoms have worsened.  He experiences intermittent moderate pain located in the right upper quadrant typically after meals.  No fevers no chills no evidence of biliary obstruction.  No evidence of jaundice. Does have a history of laparoscopic appendectomy more than 3 years ago performed by me.  He had no complications related to the surgery.  He is able to perform more than 4 METS of activity without any shortness of breath or chest pain. Hew Did have an ultrasound of the abdomen pelvis 3 years ago that have personally reviewed showing evidence of cholelithiasis and a gallbladder polyp.  Normal common bile duct.  At that time he did have also elevated LFTs. Most recent CBC was completely normal.  LFTs from 3 years ago show mild elevation of the alkaline phosphatase and AST and ALT. HPI  Past Medical History:  Diagnosis Date   Hypertension     Past Surgical History:  Procedure Laterality Date   HERNIA REPAIR     LAPAROSCOPIC APPENDECTOMY N/A 01/06/2018   Procedure: APPENDECTOMY LAPAROSCOPIC;  Surgeon: Leafy Ro, MD;  Location: [facility] ORS;  Service: General;  Laterality: N/A;   VASECTOMY  10/2012    Family History  Problem Relation Age of Onset   Heart failure Mother    Heart failure Father     Social History Social History   Tobacco Use   Smoking status: Former   Smokeless tobacco: Former    Quit date: 08/16/1995  Substance Use Topics   Alcohol use: No    Alcohol/week: 0.0 standard drinks   Drug use: No    Allergies  Allergen Reactions   Vasotec [Enalapril]    Verapamil    Codeine Nausea And Vomiting    Current Outpatient Medications   Medication Sig Dispense Refill   fluticasone (FLONASE) 50 MCG/ACT nasal spray Place 2 sprays into both nostrils daily. 16 g 5   ibuprofen (ADVIL,MOTRIN) 200 MG tablet Take 200 mg by mouth every 6 (six) hours as needed.     No current facility-administered medications for this visit.     Review of Systems Full ROS  was asked and was negative except for the information on the HPI  Physical Exam Blood pressure (!) 147/100, pulse 86, temperature 98.5 F (36.9 C), temperature source Oral, height 5\' 9"  (1.753 m), weight 242 lb 12.8 oz (110.1 kg), SpO2 96 %. CONSTITUTIONAL: NAD. EYES: Pupils are equal, round, Sclera are non-icteric. EARS, NOSE, MOUTH AND THROAT: The oropharynx is clear. The oral mucosa is pink and moist. Hearing is intact to voice. LYMPH NODES:  Lymph nodes in the neck are normal. RESPIRATORY:  Lungs are clear. There is normal respiratory effort, with equal breath sounds bilaterally, and without pathologic use of accessory muscles. CARDIOVASCULAR: Heart is regular without murmurs, gallops, or rubs. GI: The abdomen is  soft, nontender, and nondistended. There are no palpable masses. There is no hepatosplenomegaly. There are normal bowel sounds .  Prior appendectomy scars healed. GU: Rectal deferred.   MUSCULOSKELETAL: Normal muscle strength and tone. No cyanosis or edema.   SKIN: Turgor is good and there are no pathologic skin lesions  or ulcers. NEUROLOGIC: Motor and sensation is grossly normal. Cranial nerves are grossly intact. PSYCH:  Oriented to person, place and time. Affect is normal.  Data Reviewed  I have personally reviewed the patient's imaging, laboratory findings and medical records.    Assessment/Plan    Right upper quadrant pain in a patient with a history of cholelithiasis and gallbladder polyp.  This is likely biliary colic.  We will obtain repeat ultrasound of the right upper quadrant along with a CBC and CMP to reevaluate biliary anatomy. I do think that  given his symptoms and he is Gallbladder polyp and gallstone he will benefit from cholecystectomy.  I do think that he will be a good candidate for a robotic approach I discussed the procedure in detail.  The patient was given Agricultural engineer.  We discussed the risks and benefits of a laparoscopic cholecystectomy and possible cholangiogram including, but not limited to bleeding, infection, injury to surrounding structures such as the intestine or liver, bile leak, retained gallstones, need to convert to an open procedure, prolonged diarrhea, blood clots such as  DVT, common bile duct injury, anesthesia risks, and possible need for additional procedures.  The likelihood of improvement in symptoms and return to the patient's normal status is good. We discussed the typical post-operative recovery course.  We will tentatively place him on the schedule for January for robotic cholecystectomy A copy of this report was sent to the referring provider  Sterling Big, MD FACS General Surgeon 06/14/2021, 5:00 PM

## 2021-06-18 ENCOUNTER — Telehealth: Payer: Self-pay | Admitting: Surgery

## 2021-06-18 NOTE — Telephone Encounter (Signed)
Patients wife called said they looked at her husbands ultrasound over the weekend when the results came in, patients wife said it look like the ultrasound didn't show anything and is asking does he still need to have his gallbladder taken out . Please call patient and advise.

## 2021-06-21 ENCOUNTER — Telehealth: Payer: Self-pay | Admitting: Surgery

## 2021-06-21 NOTE — Telephone Encounter (Signed)
Patient called and is wanting to cancel his surgery with Dr. Everlene Farrier at this time and said he would call back when he is ready to r/s this surgery.

## 2021-06-22 NOTE — Telephone Encounter (Signed)
At patient's request surgery for 07/05/21 is cancelled.  He will call back when ready to reschedule.

## 2021-06-27 ENCOUNTER — Inpatient Hospital Stay: Admission: RE | Admit: 2021-06-27 | Payer: BC Managed Care – PPO | Source: Ambulatory Visit

## 2021-07-05 ENCOUNTER — Ambulatory Visit: Admission: RE | Admit: 2021-07-05 | Payer: BC Managed Care – PPO | Source: Home / Self Care | Admitting: Surgery

## 2021-07-05 ENCOUNTER — Encounter: Admission: RE | Payer: Self-pay | Source: Home / Self Care

## 2021-07-05 SURGERY — CHOLECYSTECTOMY, ROBOT-ASSISTED, LAPAROSCOPIC
Anesthesia: General

## 2023-10-18 IMAGING — US US ABDOMEN LIMITED
1 series · 14 of 25 positions shown · non-contrast
Comparison: Ultrasound 02/05/2018

CLINICAL DATA: History of gallstones

EXAM:
ULTRASOUND ABDOMEN LIMITED RIGHT UPPER QUADRANT

[Series 1: us abdomen limited ruq (liver/gb) · 14 of 33 slices shown]
[im 1/33]
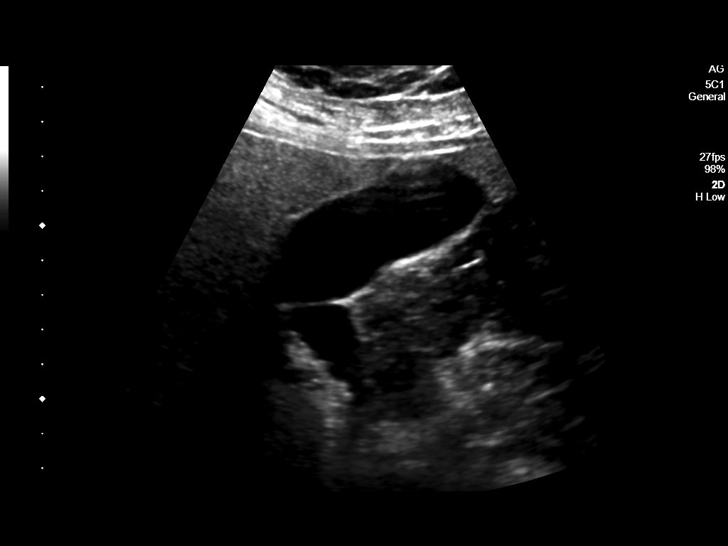
[im 3/33]
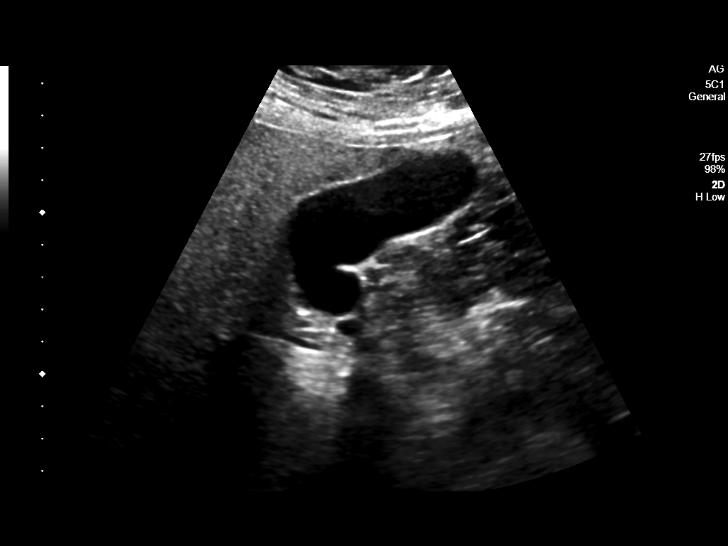
[im 6/33]
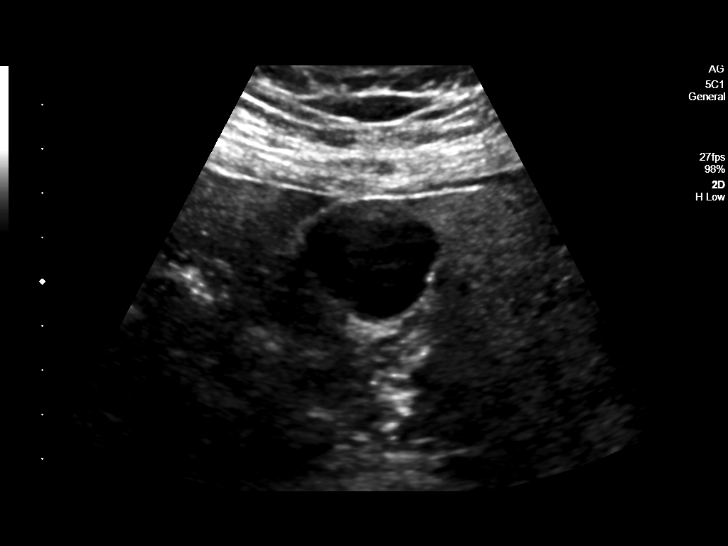
[im 9/33]
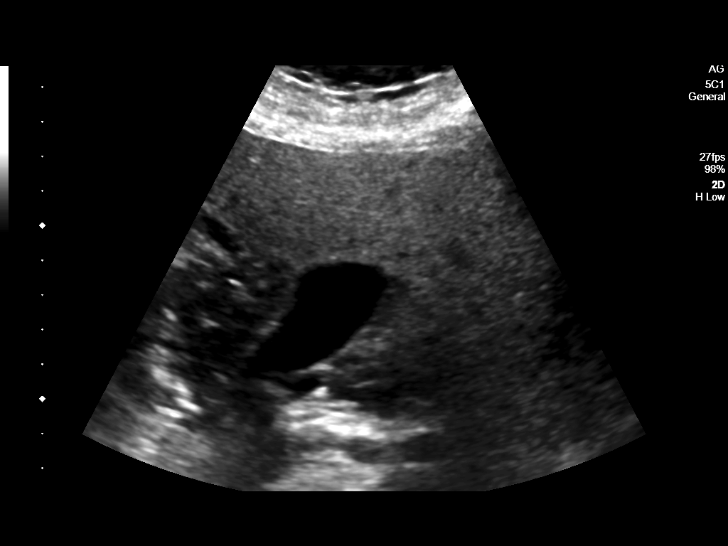
[im 11/33]
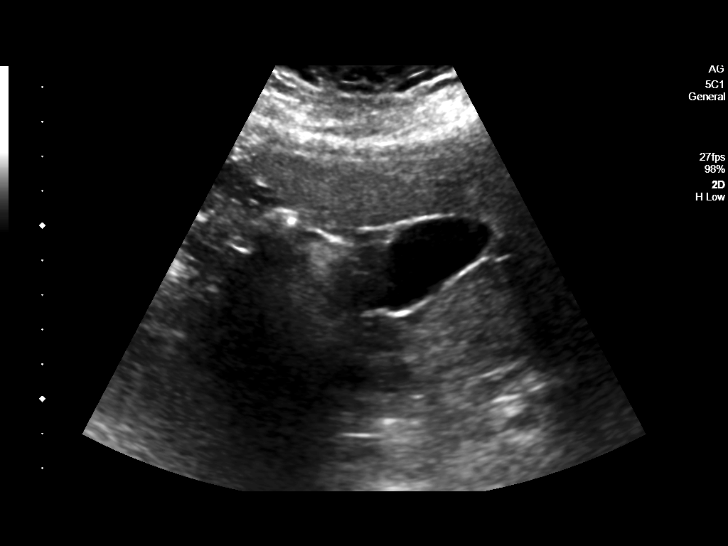
[im 13/33]
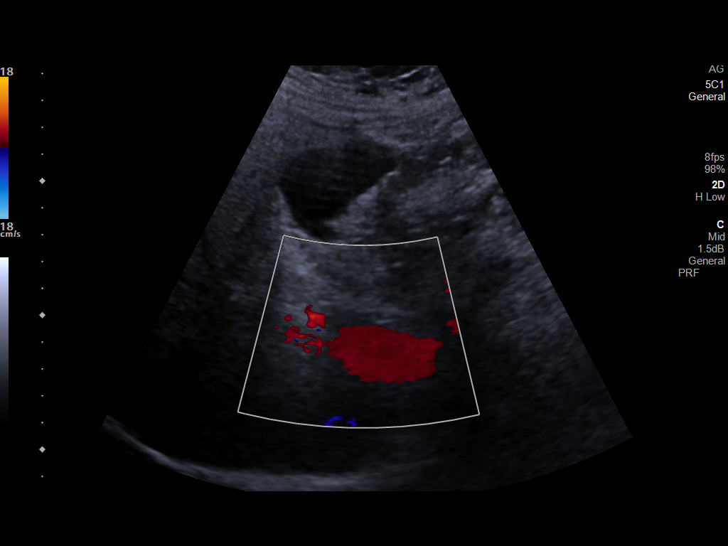
[im 15/33]
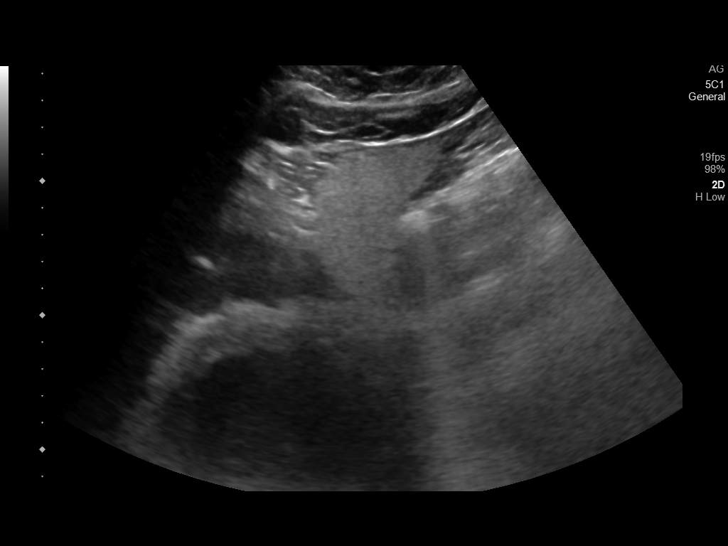
[im 18/33]
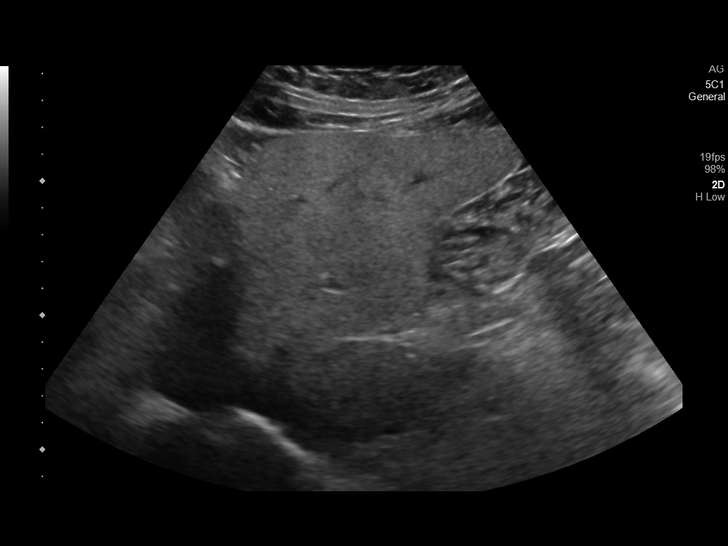
[im 21/33]
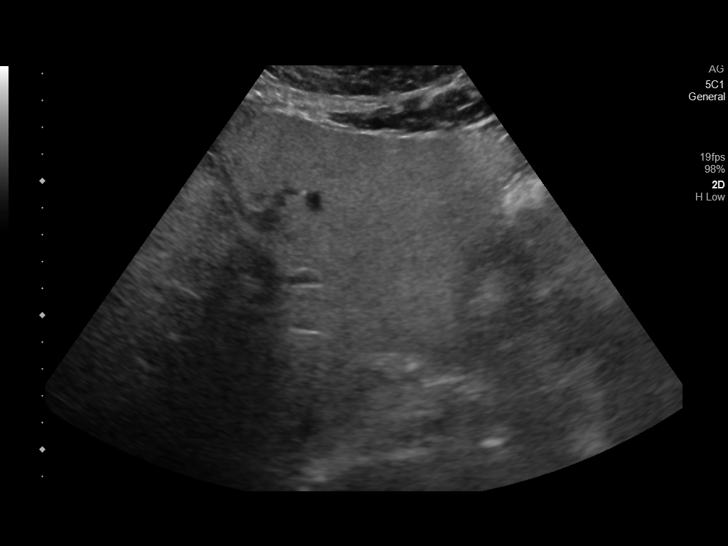
[im 22/33]
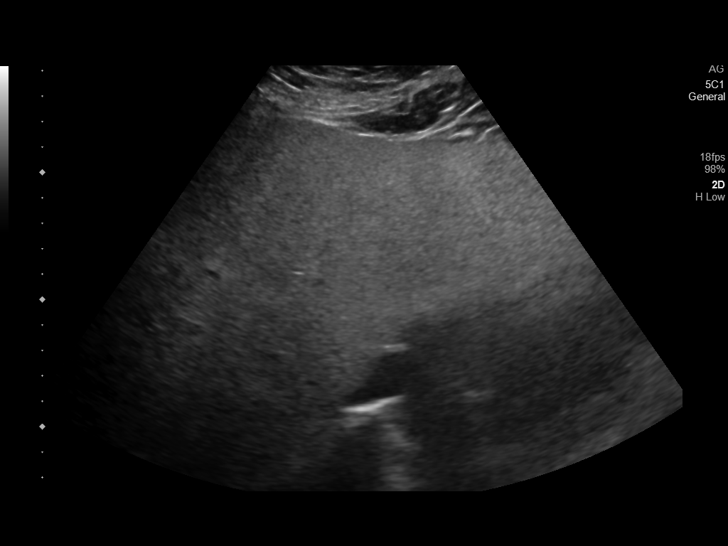
[im 25/33]
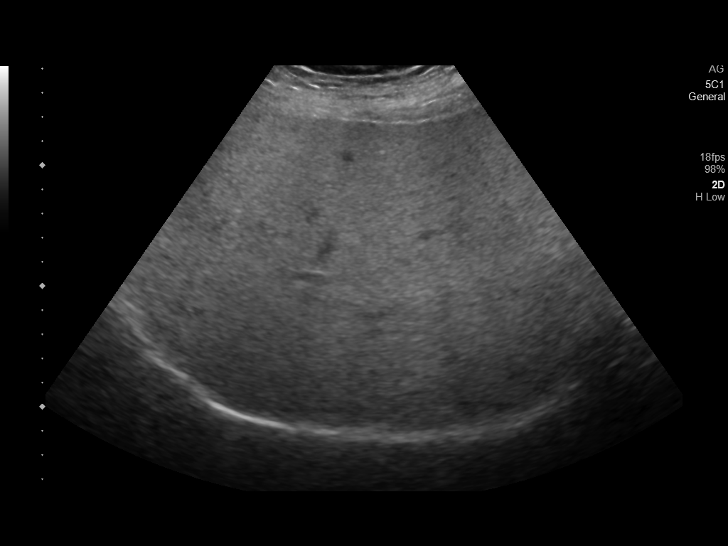
[im 27/33]
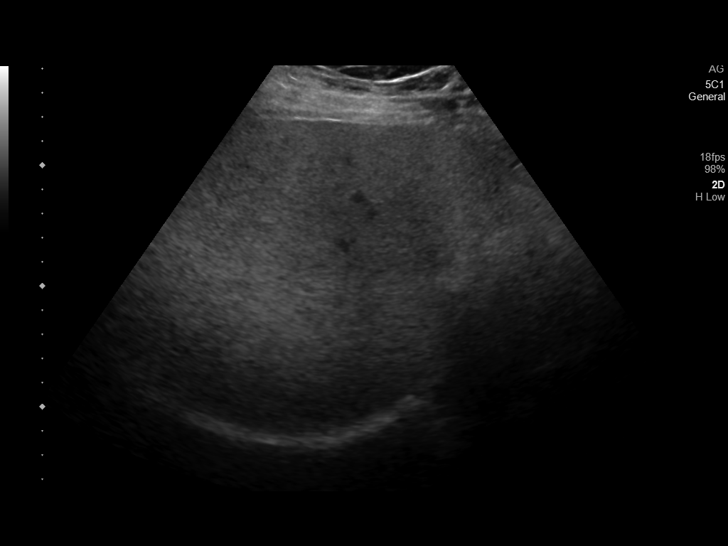
[im 30/33]
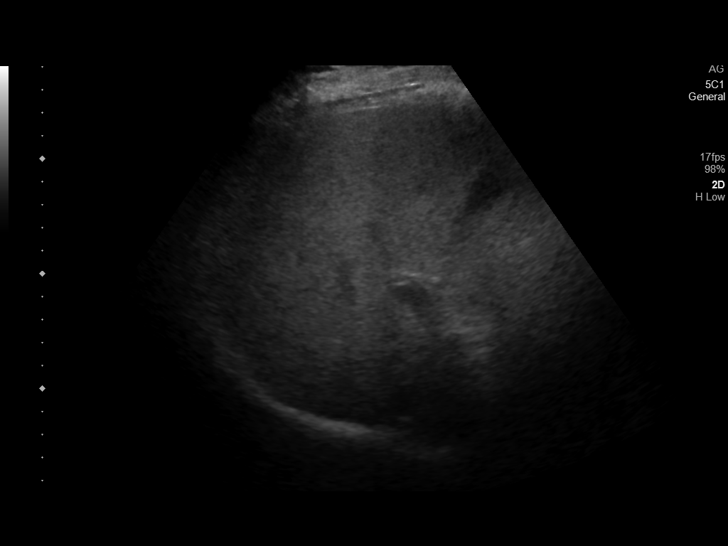
[im 33/33]
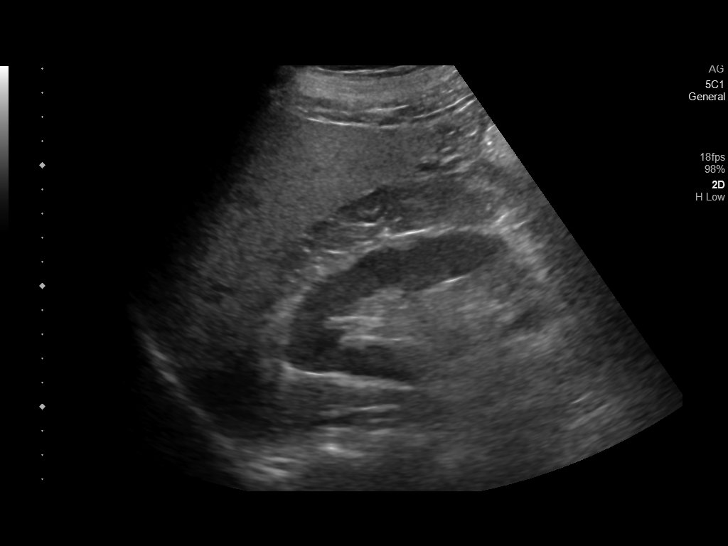

[14 of 25 positions shown; findings below may reference images not displayed]

FINDINGS: Gallbladder:

No gallstones or wall thickening visualized. No sonographic Murphy
sign noted by sonographer.

Common bile duct:

Diameter: 3 mm

Liver:

Diffusely echogenic. No focal hepatic abnormality. Portal vein is
patent on color Doppler imaging with normal direction of blood flow
towards the liver.

Other: None.
IMPRESSION: 1. Negative for gallstones.
2. Echogenic liver consistent with hepatic steatosis and or
hepatocellular disease
